# Patient Record
Sex: Male | Born: 1950 | Race: White | Hispanic: No | Marital: Married | Smoking: Former smoker
Health system: Southern US, Community
[De-identification: ages and names within clinical notes are randomized; demographics above are authoritative.]

## PROBLEM LIST (undated history)

## (undated) DIAGNOSIS — E119 Type 2 diabetes mellitus without complications: Secondary | ICD-10-CM

## (undated) DIAGNOSIS — I10 Essential (primary) hypertension: Secondary | ICD-10-CM

## (undated) DIAGNOSIS — C801 Malignant (primary) neoplasm, unspecified: Secondary | ICD-10-CM

## (undated) DIAGNOSIS — J45909 Unspecified asthma, uncomplicated: Secondary | ICD-10-CM

## (undated) HISTORY — PX: TONSILLECTOMY: SUR1361

## (undated) HISTORY — PX: OTHER SURGICAL HISTORY: SHX169

## (undated) HISTORY — PX: HEMORRHOID SURGERY: SHX153

## (undated) HISTORY — PX: APPENDECTOMY: SHX54

---

## 1998-12-12 ENCOUNTER — Ambulatory Visit (HOSPITAL_BASED_OUTPATIENT_CLINIC_OR_DEPARTMENT_OTHER): Admission: RE | Admit: 1998-12-12 | Discharge: 1998-12-12 | Payer: Self-pay | Admitting: General Surgery

## 1999-01-30 ENCOUNTER — Encounter: Admission: RE | Admit: 1999-01-30 | Discharge: 1999-04-30 | Payer: Self-pay | Admitting: Family Medicine

## 2000-02-18 ENCOUNTER — Encounter: Payer: Self-pay | Admitting: Family Medicine

## 2000-02-18 ENCOUNTER — Encounter: Admission: RE | Admit: 2000-02-18 | Discharge: 2000-02-18 | Payer: Self-pay | Admitting: Family Medicine

## 2001-08-08 ENCOUNTER — Ambulatory Visit (HOSPITAL_COMMUNITY): Admission: RE | Admit: 2001-08-08 | Discharge: 2001-08-08 | Payer: Self-pay | Admitting: Family Medicine

## 2001-08-08 ENCOUNTER — Encounter: Payer: Self-pay | Admitting: Family Medicine

## 2007-03-15 ENCOUNTER — Encounter (INDEPENDENT_AMBULATORY_CARE_PROVIDER_SITE_OTHER): Payer: Self-pay | Admitting: Specialist

## 2007-03-15 ENCOUNTER — Ambulatory Visit (HOSPITAL_COMMUNITY): Admission: RE | Admit: 2007-03-15 | Discharge: 2007-03-16 | Payer: Self-pay | Admitting: General Surgery

## 2009-06-20 ENCOUNTER — Emergency Department (HOSPITAL_BASED_OUTPATIENT_CLINIC_OR_DEPARTMENT_OTHER): Admission: EM | Admit: 2009-06-20 | Discharge: 2009-06-20 | Payer: Self-pay | Admitting: Emergency Medicine

## 2009-06-20 ENCOUNTER — Ambulatory Visit: Payer: Self-pay | Admitting: Diagnostic Radiology

## 2009-06-24 ENCOUNTER — Encounter: Admission: RE | Admit: 2009-06-24 | Discharge: 2009-06-24 | Payer: Self-pay | Admitting: Gastroenterology

## 2011-02-06 LAB — BASIC METABOLIC PANEL
BUN: 11 mg/dL (ref 6–23)
Calcium: 9.1 mg/dL (ref 8.4–10.5)
GFR calc non Af Amer: >60 mL/min (ref >60)
Glucose, Bld: 118 mg/dL — ABNORMAL HIGH (ref 70–99)
Potassium: 4.1 mEq/L (ref 3.5–5.1)
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
## 2013-01-10 ENCOUNTER — Other Ambulatory Visit: Payer: Self-pay

## 2014-11-29 ENCOUNTER — Encounter: Payer: Self-pay | Admitting: Internal Medicine

## 2015-01-09 ENCOUNTER — Other Ambulatory Visit (HOSPITAL_COMMUNITY): Payer: Self-pay

## 2015-01-09 ENCOUNTER — Inpatient Hospital Stay (HOSPITAL_COMMUNITY)
Admission: EM | Admit: 2015-01-09 | Discharge: 2015-01-11 | DRG: 202 | Disposition: A | Discharge status: Home / Self Care | Payer: BLUE CROSS/BLUE SHIELD | Attending: Internal Medicine | Admitting: Internal Medicine

## 2015-01-09 ENCOUNTER — Emergency Department (HOSPITAL_COMMUNITY): Payer: BLUE CROSS/BLUE SHIELD

## 2015-01-09 ENCOUNTER — Encounter (HOSPITAL_COMMUNITY): Payer: Self-pay

## 2015-01-09 DIAGNOSIS — J209 Acute bronchitis, unspecified: Principal | ICD-10-CM | POA: Diagnosis present

## 2015-01-09 DIAGNOSIS — Z6841 Body Mass Index (BMI) 40.0 and over, adult: Secondary | ICD-10-CM

## 2015-01-09 DIAGNOSIS — Z87891 Personal history of nicotine dependence: Secondary | ICD-10-CM

## 2015-01-09 DIAGNOSIS — Z79899 Other long term (current) drug therapy: Secondary | ICD-10-CM

## 2015-01-09 DIAGNOSIS — E1165 Type 2 diabetes mellitus with hyperglycemia: Secondary | ICD-10-CM | POA: Diagnosis present

## 2015-01-09 DIAGNOSIS — IMO0002 Reserved for concepts with insufficient information to code with codable children: Secondary | ICD-10-CM | POA: Diagnosis present

## 2015-01-09 DIAGNOSIS — R6510 Systemic inflammatory response syndrome (SIRS) of non-infectious origin without acute organ dysfunction: Secondary | ICD-10-CM | POA: Diagnosis present

## 2015-01-09 DIAGNOSIS — I10 Essential (primary) hypertension: Secondary | ICD-10-CM | POA: Diagnosis present

## 2015-01-09 DIAGNOSIS — J45909 Unspecified asthma, uncomplicated: Secondary | ICD-10-CM | POA: Diagnosis present

## 2015-01-09 DIAGNOSIS — R0602 Shortness of breath: Secondary | ICD-10-CM

## 2015-01-09 DIAGNOSIS — E669 Obesity, unspecified: Secondary | ICD-10-CM | POA: Diagnosis present

## 2015-01-09 HISTORY — DX: Type 2 diabetes mellitus without complications: E11.9

## 2015-01-09 HISTORY — DX: Unspecified asthma, uncomplicated: J45.909

## 2015-01-09 HISTORY — DX: Essential (primary) hypertension: I10

## 2015-01-09 HISTORY — DX: Malignant (primary) neoplasm, unspecified: C80.1

## 2015-01-09 LAB — BASIC METABOLIC PANEL
Anion gap: 12 (ref 5–15)
BUN: 13 mg/dL (ref 6–23)
CALCIUM: 9.4 mg/dL (ref 8.4–10.5)
CHLORIDE: 96 mmol/L (ref 96–112)
CO2: 24 mmol/L (ref 19–32)
CREATININE: 1.04 mg/dL (ref 0.50–1.35)
GFR calc non Af Amer: 75 mL/min — ABNORMAL LOW (ref >90)
GFR, EST AFRICAN AMERICAN: 87 mL/min — AB (ref >90)
GLUCOSE: 242 mg/dL — AB (ref 70–99)
Potassium: 4.3 mmol/L (ref 3.5–5.1)
SODIUM: 132 mmol/L — AB (ref 135–145)

## 2015-01-09 LAB — CBC WITH DIFFERENTIAL/PLATELET
Basophils Absolute: 0 10*3/uL (ref 0.0–0.1)
Basophils Relative: 0 % (ref 0–1)
EOS ABS: 0.1 10*3/uL (ref 0.0–0.7)
EOS PCT: 1 % (ref 0–5)
HEMATOCRIT: 46.1 % (ref 39.0–52.0)
HEMOGLOBIN: 16.3 g/dL (ref 13.0–17.0)
LYMPHS ABS: 0.5 10*3/uL — AB (ref 0.7–4.0)
Lymphocytes Relative: 4 % — ABNORMAL LOW (ref 12–46)
MCH: 31.3 pg (ref 26.0–34.0)
MCHC: 35.4 g/dL (ref 30.0–36.0)
MCV: 88.5 fL (ref 78.0–100.0)
Monocytes Absolute: 0.5 10*3/uL (ref 0.1–1.0)
Monocytes Relative: 4 % (ref 3–12)
NEUTROS PCT: 91 % — AB (ref 43–77)
Neutro Abs: 11.1 10*3/uL — ABNORMAL HIGH (ref 1.7–7.7)
PLATELETS: 180 10*3/uL (ref 150–400)
RBC: 5.21 MIL/uL (ref 4.22–5.81)
RDW: 13.6 % (ref 11.5–15.5)
WBC: 12.1 10*3/uL — ABNORMAL HIGH (ref 4.0–10.5)

## 2015-01-09 LAB — I-STAT ARTERIAL BLOOD GAS, ED
ACID-BASE DEFICIT: 2 mmol/L (ref 0.0–2.0)
BICARBONATE: 22 meq/L (ref 20.0–24.0)
O2 SAT: 96 %
PCO2 ART: 34.7 mmHg — AB (ref 35.0–45.0)
TCO2: 23 mmol/L (ref 0–100)
pH, Arterial: 7.41 (ref 7.350–7.450)
pO2, Arterial: 80 mmHg (ref 80.0–100.0)

## 2015-01-09 LAB — I-STAT CG4 LACTIC ACID, ED
LACTIC ACID, VENOUS: 2.52 mmol/L — AB (ref 0.5–2.0)
LACTIC ACID, VENOUS: 2.86 mmol/L — AB (ref 0.5–2.0)

## 2015-01-09 LAB — I-STAT TROPONIN, ED: TROPONIN I, POC: 0 ng/mL (ref 0.00–0.08)

## 2015-01-09 MED ORDER — SODIUM CHLORIDE 0.9 % IV BOLUS (SEPSIS)
30.0000 mL/kg | Freq: Once | INTRAVENOUS | Status: AC
  Administered 2015-01-09: 4149 mL via INTRAVENOUS

## 2015-01-09 MED ORDER — IOHEXOL 350 MG/ML SOLN
80.0000 mL | Freq: Once | INTRAVENOUS | Status: AC | PRN
  Administered 2015-01-09: 80 mL via INTRAVENOUS

## 2015-01-09 MED ORDER — ACETAMINOPHEN 325 MG PO TABS
650.0000 mg | ORAL_TABLET | Freq: Once | ORAL | Status: AC
  Administered 2015-01-09: 650 mg via ORAL
  Filled 2015-01-09: qty 2

## 2015-01-09 MED ORDER — AZITHROMYCIN 500 MG IV SOLR
500.0000 mg | Freq: Once | INTRAVENOUS | Status: AC
  Administered 2015-01-09: 500 mg via INTRAVENOUS
  Filled 2015-01-09: qty 500

## 2015-01-09 MED ORDER — DEXTROSE 5 % IV SOLN
1.0000 g | Freq: Once | INTRAVENOUS | Status: AC
  Administered 2015-01-09: 1 g via INTRAVENOUS
  Filled 2015-01-09: qty 10

## 2015-01-09 NOTE — ED Notes (Signed)
Per EMS, pt started having severe SOB, so he drove himself to the [facility]. The MD there stated that the pt started having bronchospasms that caused the pt to become cyanotic and confused. Pt with bronchitis x 2 weeks. Pt was given 2 mg Albuterol at the MD office, and the pt's symptoms improved. Pt was alert and oriented for EMS upon their arrival.

## 2015-01-09 NOTE — ED Provider Notes (Signed)
CSN: [id number]     Arrival date & time 01/09/15  1952 History   First MD Initiated Contact with Patient 01/09/15 1953     Chief Complaint  Patient presents with  . Shortness of Breath     (Consider location/radiation/quality/duration/timing/severity/associated sxs/prior Treatment) HPI   Patient is presenting with worsening shortness of breath, worsened over the past day.  He has been having a week of cough which has been productive.  No known sick contacts. He has been being followed as an outpatient for presumed bronchitis.  Went to urgent care today where he was found to be short of breath, in resp distress and he was sent to the emergency department for further evaluation.  No chest pain.  No past medical history on file. No past surgical history on file. No family history on file. History  Substance Use Topics  . Smoking status: Not on file  . Smokeless tobacco: Not on file  . Alcohol Use: Not on file    Review of Systems  Constitutional: Positive for fever. Negative for chills.  Eyes: Negative for redness.  Respiratory: Positive for cough and shortness of breath.   Cardiovascular: Negative for chest pain.  Gastrointestinal: Negative for nausea, vomiting, abdominal pain and diarrhea.  Genitourinary: Negative for dysuria.  Skin: Negative for rash.  Neurological: Negative for headaches.  All other systems reviewed and are negative.     Allergies  Review of patient's allergies indicates not on file.  Home Medications   Prior to Admission medications   Medication Sig Start Date End Date Taking? Authorizing Provider  lisinopril (PRINIVIL,ZESTRIL) 10 MG tablet Take 10 mg by mouth daily. 01/03/15   Historical Provider, MD  VICTOZA 18 MG/3ML SOPN Inject 1.8 mg into the skin daily. 10/13/14   Historical Provider, MD   BP 129/83 mmHg  Pulse 127  Temp(Src) 103.1 F (39.5 C)  Resp 31  SpO2 94% Physical Exam  Constitutional: He appears distressed.  HENT:  Head:  Normocephalic and atraumatic.  Eyes: EOM are normal. Pupils are equal, round, and reactive to light.  Neck: Normal range of motion. Neck supple.  Cardiovascular: Normal rate.   Pulmonary/Chest: He is in respiratory distress. He has wheezes (faint).  Abdominal: Soft. There is no tenderness.  Skin: No rash noted. He is diaphoretic.    ED Course  Procedures (including critical care time) Labs Review Labs Reviewed  CULTURE, BLOOD (ROUTINE X 2)  CULTURE, BLOOD (ROUTINE X 2)  CBC WITH DIFFERENTIAL/PLATELET  BASIC METABOLIC PANEL  BRAIN NATRIURETIC PEPTIDE  LACTIC ACID, PLASMA  LACTIC ACID, PLASMA  I-STAT TROPOININ, ED  I-STAT ARTERIAL BLOOD GAS, ED    Imaging Review No results found.   EKG Interpretation None      MDM   Final diagnoses:  None    No sig PMH is presenting with shortness of breath, fever, cough, worsened over the past day.    Exam as above, febrile, tachy, tachypnic.  Concern for sepsis.  Will obtain bcx, cover for CAP w/ rocephin azithro.  Will obtain labs/lactic.  Lactic elevated will give 1ml/kg NS bolus.  Will give some albut.  WOB improved after albut.  Patient improving.  CXR w/o infiltrate. CTA PE study obtained and w/o infiltrate/PE  Will admit for likely PNA vs. Influenza. Influenza swab not back yet.    Arrangements made for admission. Feel floor stable    Jarome Matin, MD 01/10/15 0093  Jola Schmidt, MD 01/14/15 [phone]

## 2015-01-10 ENCOUNTER — Encounter (HOSPITAL_COMMUNITY): Payer: Self-pay | Admitting: Internal Medicine

## 2015-01-10 DIAGNOSIS — A419 Sepsis, unspecified organism: Secondary | ICD-10-CM

## 2015-01-10 DIAGNOSIS — E1165 Type 2 diabetes mellitus with hyperglycemia: Secondary | ICD-10-CM

## 2015-01-10 DIAGNOSIS — R6510 Systemic inflammatory response syndrome (SIRS) of non-infectious origin without acute organ dysfunction: Secondary | ICD-10-CM | POA: Diagnosis present

## 2015-01-10 DIAGNOSIS — J209 Acute bronchitis, unspecified: Secondary | ICD-10-CM | POA: Diagnosis present

## 2015-01-10 DIAGNOSIS — E669 Obesity, unspecified: Secondary | ICD-10-CM | POA: Diagnosis present

## 2015-01-10 DIAGNOSIS — I10 Essential (primary) hypertension: Secondary | ICD-10-CM | POA: Diagnosis present

## 2015-01-10 DIAGNOSIS — J45909 Unspecified asthma, uncomplicated: Secondary | ICD-10-CM | POA: Diagnosis present

## 2015-01-10 DIAGNOSIS — J208 Acute bronchitis due to other specified organisms: Secondary | ICD-10-CM | POA: Diagnosis not present

## 2015-01-10 DIAGNOSIS — Z6841 Body Mass Index (BMI) 40.0 and over, adult: Secondary | ICD-10-CM | POA: Diagnosis not present

## 2015-01-10 DIAGNOSIS — IMO0002 Reserved for concepts with insufficient information to code with codable children: Secondary | ICD-10-CM | POA: Diagnosis present

## 2015-01-10 DIAGNOSIS — Z79899 Other long term (current) drug therapy: Secondary | ICD-10-CM | POA: Diagnosis not present

## 2015-01-10 DIAGNOSIS — Z87891 Personal history of nicotine dependence: Secondary | ICD-10-CM | POA: Diagnosis not present

## 2015-01-10 DIAGNOSIS — R0602 Shortness of breath: Secondary | ICD-10-CM | POA: Diagnosis present

## 2015-01-10 LAB — CBC WITH DIFFERENTIAL/PLATELET
Basophils Absolute: 0 10*3/uL (ref 0.0–0.1)
Basophils Relative: 0 % (ref 0–1)
Eosinophils Absolute: 0 10*3/uL (ref 0.0–0.7)
Eosinophils Relative: 0 % (ref 0–5)
HEMATOCRIT: 41.3 % (ref 39.0–52.0)
HEMOGLOBIN: 14.1 g/dL (ref 13.0–17.0)
LYMPHS ABS: 0.9 10*3/uL (ref 0.7–4.0)
Lymphocytes Relative: 7 % — ABNORMAL LOW (ref 12–46)
MCH: 30.2 pg (ref 26.0–34.0)
MCHC: 34.1 g/dL (ref 30.0–36.0)
MCV: 88.4 fL (ref 78.0–100.0)
MONOS PCT: 9 % (ref 3–12)
Monocytes Absolute: 1.2 10*3/uL — ABNORMAL HIGH (ref 0.1–1.0)
NEUTROS ABS: 11.5 10*3/uL — AB (ref 1.7–7.7)
NEUTROS PCT: 84 % — AB (ref 43–77)
Platelets: 197 10*3/uL (ref 150–400)
RBC: 4.67 MIL/uL (ref 4.22–5.81)
RDW: 13.9 % (ref 11.5–15.5)
WBC: 13.6 10*3/uL — ABNORMAL HIGH (ref 4.0–10.5)

## 2015-01-10 LAB — COMPREHENSIVE METABOLIC PANEL
ALK PHOS: 32 U/L — AB (ref 39–117)
ALT: 57 U/L — ABNORMAL HIGH (ref 0–53)
AST: 42 U/L — AB (ref 0–37)
Albumin: 3.3 g/dL — ABNORMAL LOW (ref 3.5–5.2)
Anion gap: 8 (ref 5–15)
BILIRUBIN TOTAL: 0.7 mg/dL (ref 0.3–1.2)
BUN: 9 mg/dL (ref 6–23)
CALCIUM: 8.3 mg/dL — AB (ref 8.4–10.5)
CO2: 26 mmol/L (ref 19–32)
Chloride: 101 mmol/L (ref 96–112)
Creatinine, Ser: 0.92 mg/dL (ref 0.50–1.35)
GFR calc Af Amer: >90 mL/min (ref >90)
GFR calc non Af Amer: 89 mL/min — ABNORMAL LOW (ref >90)
Glucose, Bld: 206 mg/dL — ABNORMAL HIGH (ref 70–99)
POTASSIUM: 4.2 mmol/L (ref 3.5–5.1)
SODIUM: 135 mmol/L (ref 135–145)
Total Protein: 6 g/dL (ref 6.0–8.3)

## 2015-01-10 LAB — URINALYSIS, ROUTINE W REFLEX MICROSCOPIC
BILIRUBIN URINE: NEGATIVE
Glucose, UA: 100 mg/dL — AB
HGB URINE DIPSTICK: NEGATIVE
Ketones, ur: NEGATIVE mg/dL
Leukocytes, UA: NEGATIVE
Nitrite: NEGATIVE
Protein, ur: NEGATIVE mg/dL
Specific Gravity, Urine: 1.027 (ref 1.005–1.030)
UROBILINOGEN UA: 0.2 mg/dL (ref 0.0–1.0)
pH: 5.5 (ref 5.0–8.0)

## 2015-01-10 LAB — GLUCOSE, CAPILLARY
GLUCOSE-CAPILLARY: 162 mg/dL — AB (ref 70–99)
GLUCOSE-CAPILLARY: 165 mg/dL — AB (ref 70–99)
Glucose-Capillary: 247 mg/dL — ABNORMAL HIGH (ref 70–99)
Glucose-Capillary: 181 mg/dL — ABNORMAL HIGH (ref 70–99)
Glucose-Capillary: 137 mg/dL — ABNORMAL HIGH (ref 70–99)

## 2015-01-10 LAB — INFLUENZA PANEL BY PCR (TYPE A & B)
H1N1FLUPCR: NOT DETECTED
INFLAPCR: NEGATIVE
INFLBPCR: NEGATIVE

## 2015-01-10 LAB — BRAIN NATRIURETIC PEPTIDE: B NATRIURETIC PEPTIDE 5: 23 pg/mL (ref 0.0–100.0)

## 2015-01-10 MED ORDER — INSULIN ASPART 100 UNIT/ML ~~LOC~~ SOLN
0.0000 [IU] | Freq: Three times a day (TID) | SUBCUTANEOUS | Status: DC
  Administered 2015-01-10 – 2015-01-11 (×3): 2 [IU] via SUBCUTANEOUS

## 2015-01-10 MED ORDER — ACETAMINOPHEN 325 MG PO TABS: 650.0000 mg | ORAL_TABLET | Freq: Four times a day (QID) | ORAL | Status: DC | PRN

## 2015-01-10 MED ORDER — SODIUM CHLORIDE 0.9 % IV SOLN
INTRAVENOUS | Status: AC
  Administered 2015-01-10 (×2): via INTRAVENOUS

## 2015-01-10 MED ORDER — LIRAGLUTIDE 18 MG/3ML ~~LOC~~ SOPN
1.8000 mg | PEN_INJECTOR | Freq: Every day | SUBCUTANEOUS | Status: DC
  Administered 2015-01-10 – 2015-01-11 (×2): 1.8 mg via SUBCUTANEOUS

## 2015-01-10 MED ORDER — ACETAMINOPHEN 650 MG RE SUPP: 650.0000 mg | Freq: Four times a day (QID) | RECTAL | Status: DC | PRN

## 2015-01-10 MED ORDER — CEFTRIAXONE SODIUM IN DEXTROSE 20 MG/ML IV SOLN
1.0000 g | INTRAVENOUS | Status: DC
  Administered 2015-01-10: 1 g via INTRAVENOUS
  Filled 2015-01-10 (×2): qty 50

## 2015-01-10 MED ORDER — DEXTROSE 5 % IV SOLN
500.0000 mg | INTRAVENOUS | Status: DC
  Administered 2015-01-10: 500 mg via INTRAVENOUS
  Filled 2015-01-10 (×2): qty 500

## 2015-01-10 MED ORDER — ONDANSETRON HCL 4 MG/2ML IJ SOLN: 4.0000 mg | Freq: Four times a day (QID) | INTRAMUSCULAR | Status: DC | PRN

## 2015-01-10 MED ORDER — ONDANSETRON HCL 4 MG PO TABS: 4.0000 mg | ORAL_TABLET | Freq: Four times a day (QID) | ORAL | Status: DC | PRN

## 2015-01-10 MED ORDER — ALBUTEROL SULFATE (2.5 MG/3ML) 0.083% IN NEBU: 2.5000 mg | INHALATION_SOLUTION | RESPIRATORY_TRACT | Status: DC | PRN

## 2015-01-10 MED ORDER — ALBUTEROL SULFATE (2.5 MG/3ML) 0.083% IN NEBU
2.5000 mg | INHALATION_SOLUTION | Freq: Four times a day (QID) | RESPIRATORY_TRACT | Status: DC
  Administered 2015-01-10 – 2015-01-11 (×3): 2.5 mg via RESPIRATORY_TRACT
  Filled 2015-01-10 (×4): qty 3

## 2015-01-10 MED ORDER — ENOXAPARIN SODIUM 80 MG/0.8ML ~~LOC~~ SOLN
70.0000 mg | SUBCUTANEOUS | Status: DC
  Administered 2015-01-10 – 2015-01-11 (×2): 70 mg via SUBCUTANEOUS
  Filled 2015-01-10 (×3): qty 0.8

## 2015-01-10 MED ORDER — LISINOPRIL 10 MG PO TABS
10.0000 mg | ORAL_TABLET | Freq: Every day | ORAL | Status: DC
  Administered 2015-01-10 – 2015-01-11 (×2): 10 mg via ORAL
  Filled 2015-01-10 (×2): qty 1

## 2015-01-10 NOTE — H&P (Signed)
[facility] History and Physical  Marc Serrano TKZ:[id number] DOB: Jul 09, 1951 DOA: 01/09/2015  Referring physician: ER physician. PCP: Reginia Naas, MD   Chief Complaint: Shortness of breath and fever.  HPI: Marc Serrano is a 64 y.o. male with history of diabetes mellitus type 2 and hypertension was referred to the ER by an urgent care center for fever and shortness of breath. Patient states that he has been having cough for the last few days and last afternoon started developing shortness of breath with wheezing and fever chills and diaphoresis. Denies any chest pain or palpitations. Patient had gone to the urgent care and was referred to the ER. In the ER patient was found to have fever 103F. Tachycardia. Patient complained of productive cough yellowish in color. Denies any dysuria or discharge or diarrhea. CT of the chest was negative for any PE or any infiltrates. Blood cultures were obtained and since patient was having productive cough patient was started on empiric antibiotics for possible pneumonia. Patient has been admitted for further management. On my exam patient's wheezing is largely improved after patient got nebulizer.  Review of Systems: As presented in the history of presenting illness, rest negative.  Past Medical History  Diagnosis Date  . Asthma   . Cancer   . Diabetes mellitus without complication   . Hypertension    Past Surgical History  Procedure Laterality Date  . Appendectomy    . Tonsillectomy    . Sphincterectomy rectal    . Hemorrhoid surgery     Social History:  reports that he has quit smoking. He does not have any smokeless tobacco history on file. He reports that he drinks alcohol. His drug history is not on file. Where does patient live home. Can patient participate in ADLs? [facility]  Not on File  Family History:  Family History  Problem Relation Age of Onset  . Congestive Heart Failure Father   . Diabetes Mellitus II Father   . CAD  Brother       Prior to Admission medications   Medication Sig Start Date End Date Taking? Authorizing Provider  clobetasol cream (TEMOVATE) 5.73 % Apply 1 application topically as needed (for itching).   Yes Historical Provider, MD  lisinopril (PRINIVIL,ZESTRIL) 10 MG tablet Take 10 mg by mouth daily. 01/03/15  Yes Historical Provider, MD  VICTOZA 18 MG/3ML SOPN Inject 1.8 mg into the skin daily. 10/13/14  Yes Historical Provider, MD    Physical Exam: Filed Vitals:   01/09/15 2145 01/09/15 2330 01/10/15 0011 01/10/15 0030  BP: 134/75 113/65 114/77 108/70  Pulse: 125 102 102 101  Temp:      TempSrc:      Resp: 17 25 11 26   Weight:      SpO2: 97% 96% 94% 95%     General:  Obese not in distress.  Eyes: Anicteric no pallor.  ENT: No discharge from the ears eyes nose and mouth.  Neck: No mass felt.  Cardiovascular: S1-S2 heard.  Respiratory: No rhonchi or crepitations.  Abdomen: Soft nontender bowel sounds present.  Skin: No rash.  Musculoskeletal: No edema.  Psychiatric: Appears normal.  Neurologic: Alert awake oriented to time place and person. Moves all extremities.  Labs on Admission:  Basic Metabolic Panel:  Recent Labs Lab 01/09/15 2028  NA 132*  K 4.3  CL 96  CO2 24  GLUCOSE 242*  BUN 13  CREATININE 1.04  CALCIUM 9.4   Liver Function Tests: No results for input(s): AST, ALT,  ALKPHOS, BILITOT, PROT, ALBUMIN in the last 168 hours. No results for input(s): LIPASE, AMYLASE in the last 168 hours. No results for input(s): AMMONIA in the last 168 hours. CBC:  Recent Labs Lab 01/09/15 2028  WBC 12.1*  NEUTROABS 11.1*  HGB 16.3  HCT 46.1  MCV 88.5  PLT 180   Cardiac Enzymes: No results for input(s): CKTOTAL, CKMB, CKMBINDEX, TROPONINI in the last 168 hours.  BNP (last 3 results) No results for input(s): BNP in the last 8760 hours.  ProBNP (last 3 results) No results for input(s): PROBNP in the last 8760 hours.  CBG: No results for input(s):  GLUCAP in the last 168 hours.  Radiological Exams on Admission: Ct Angio Chest Pe W/cm &/or Wo Cm  01/09/2015   CLINICAL DATA:  Severe shortness of breath. Initial presentation thecal position clinic. Diagnosed with bronchospasm and bronchitis.  EXAM: CT ANGIOGRAPHY CHEST WITH CONTRAST  TECHNIQUE: Multidetector CT imaging of the chest was performed using the standard protocol during bolus administration of intravenous contrast. Multiplanar CT image reconstructions and MIPs were obtained to evaluate the vascular anatomy.  CONTRAST:  28mL OMNIPAQUE IOHEXOL 350 MG/ML SOLN  COMPARISON:  None.  FINDINGS: Technically adequate study with moderately good opacification of the central and proximal segmental pulmonary arteries. Some limitation of contrast bolus results in poor visualization of smaller peripheral vessels. No focal filling defect is demonstrated to suggest significant central pulmonary embolus.  Normal heart size. Normal caliber thoracic aorta. No evidence of aortic dissection. Great vessel origins are patent. Coronary artery and aortic calcifications. No significant lymphadenopathy in the chest. Esophagus is decompressed. Small esophageal hiatal hernia.  Evaluation of lungs is limited due to motion artifact. There is evidence of atelectasis in the lung bases. No focal consolidation or airspace disease. Airways appear patent. No pleural effusion. No pneumothorax.  Included portions of the upper abdominal organs demonstrate diffuse fatty infiltration of the liver. Degenerative changes in the spine. No destructive bone lesions.  Review of the MIP images confirms the above findings.  IMPRESSION: No evidence of significant central pulmonary embolus although peripheral vessels are not well seen. Atelectasis in the lung bases. No focal consolidation. Diffuse fatty infiltration of the liver.   Electronically Signed   By: Lucienne Capers M.D.   On: 01/09/2015 23:20   Dg Chest Portable 1 View  01/09/2015    CLINICAL DATA:  Shortness of breath, fever.  Symptoms for 2 weeks.  EXAM: PORTABLE CHEST - 1 VIEW  COMPARISON:  03/13/2007  FINDINGS: Very low lung volumes accentuating the cardiac size. Likely mild cardiomegaly. Mild bibasilar subsegmental atelectasis, no consolidation to suggest pneumonia. No large pleural effusion or pneumothorax. No acute osseous abnormalities are seen.  IMPRESSION: Hypoventilatory chest with bibasilar subsegmental atelectasis. Likely mild cardiomegaly.   Electronically Signed   By: Jeb Levering M.D.   On: 01/09/2015 21:01    EKG: Independently reviewed. Sinus tachycardia.  Assessment/Plan Principal Problem:   SIRS (systemic inflammatory response syndrome) Active Problems:   Diabetes mellitus type 2, uncontrolled   Hypertension   1. SIRS - sources respiratory tract. There is no definite infiltrates in the chest x-ray or CT chest. But given patient's symptoms at this time patient has been placed empirically on antibiotics for possible pneumonia. Follow blood cultures UA is pending. Check influenza PCR. Continue hydration. 2. Diabetes mellitus type 2 uncontrolled - in addition to patient's home medication I'll place patient on sliding scale coverage. Closely follow CBG. 3. Hypertension - continue medications.   DVT Prophylaxis  Lovenox.  Code Status: Full code.  Family Communication: Patient's family at the bedside.  Disposition Plan: Admit to inpatient.    Dorothey Oetken N. [facility] Pager [phone].  If 7PM-7AM, please contact night-coverage [email or web address] Password [email or web address] 01/10/2015, 1:04 AM

## 2015-01-10 NOTE — Progress Notes (Signed)
Pt seen and examined, admitted this am per Dr.Kakrakandy, pls see H&P for details 1. Early pneumonia vs bronchitis -improving, temp down -continue ceftriaxone and zithromax -FU blood Cx  2. DM 2: -SSI, victoza on hold  3. HTN -continue lisinopril  DVT proph: lovenox  Domenic Polite, MD [phone]

## 2015-01-10 NOTE — Progress Notes (Addendum)
64yo male c/o severe SOB after dx of bronchitis x2wk, CXR/CT negative but concern for PNA, to begin empiric ABX.  Will start Rocephin 1g IV Q24H and monitor CBC and Cx.  Wynona Neat, PharmD, BCPS 01/10/2015 1:38 AM   Addendum Patient is flagged for pharmacy review for other medications aside from ceftriaxone consult. Therefore, will sign off of formal consult since no renal adjustment is required for ceftriaxone and will follow peripherally for c/s and LOT.  Nalda Shackleford D. Eyvette Cordon, PharmD, BCPS Clinical Pharmacist Pager: [phone] 01/10/2015 8:59 AM

## 2015-01-10 NOTE — Progress Notes (Signed)
ANTICOAGULATION CONSULT NOTE - Initial Consult  Pharmacy Consult for Lovenox Indication: VTE prophylaxis  Allergies  Allergen Reactions  . Ceftin [Cefuroxime Axetil] Diarrhea    Patient Measurements: Height: 5\' 8"  (172.7 cm) Weight: (!) 308 lb 3.2 oz (139.799 kg) IBW/kg (Calculated) : 68.4   Vital Signs: Temp: 97.7 F (36.5 C) (03/11 0139) Temp Source: Oral (03/11 0139) BP: 126/74 mmHg (03/11 0139) Pulse Rate: 93 (03/11 0139)  Labs:  Recent Labs  01/09/15 2028  HGB 16.3  HCT 46.1  PLT 180  CREATININE 1.04    Estimated Creatinine Clearance: 101 mL/min (by C-G formula based on Cr of 1.04).   Medical History: Past Medical History  Diagnosis Date  . Asthma   . Cancer   . Diabetes mellitus without complication   . Hypertension     Medications:  Prescriptions prior to admission  Medication Sig Dispense Refill Last Dose  . clobetasol cream (TEMOVATE) 1.54 % Apply 1 application topically as needed (for itching).   Past Month at Unknown time  . lisinopril (PRINIVIL,ZESTRIL) 10 MG tablet Take 10 mg by mouth daily.  0 01/09/2015 at Unknown time  . VICTOZA 18 MG/3ML SOPN Inject 1.8 mg into the skin daily.  0 01/09/2015 at Unknown time   Scheduled:  . azithromycin  500 mg Intravenous Q24H  . cefTRIAXone (ROCEPHIN)  IV  1 g Intravenous Q24H  . enoxaparin (LOVENOX) injection  70 mg Subcutaneous Q24H  . insulin aspart  0-9 Units Subcutaneous TID WC  . Liraglutide  1.8 mg Subcutaneous Daily  . lisinopril  10 mg Oral Daily    Assessment: 64 y.o male admitted with SIRS.  Lovenox for VTE prophylaxis ordered.  Obesity, BMI 47.  H/H 16.3/46.1, pltc 180K.  SCr 1.04, CrCl ~ 100 ml/min. No bleeding reported. Will adjust lovenox dose to 0.5mg /kg/q24h for VTE prophylaxis.    Goal of Therapy:  Heparin level 0.3-0.6 units/ml Monitor platelets by anticoagulation protocol: Yes   Plan:  Lovenox 70 mg SQ q24h (0.5mg /kg q24h) CBC q72h  Nicole Cella, RPh Clinical  Pharmacist Pager: [phone] 01/10/2015,2:12 AM

## 2015-01-10 NOTE — Progress Notes (Signed)
Pt states he will place himself on cpap when ready. Encouraged to call with questions. CPAP 10 setup and water refilled in water chamber. No needs at this time.

## 2015-01-11 DIAGNOSIS — J209 Acute bronchitis, unspecified: Secondary | ICD-10-CM | POA: Diagnosis present

## 2015-01-11 DIAGNOSIS — J208 Acute bronchitis due to other specified organisms: Secondary | ICD-10-CM

## 2015-01-11 LAB — GLUCOSE, CAPILLARY
GLUCOSE-CAPILLARY: 162 mg/dL — AB (ref 70–99)
Glucose-Capillary: 100 mg/dL — ABNORMAL HIGH (ref 70–99)

## 2015-01-11 MED ORDER — LEVOFLOXACIN 500 MG PO TABS: 500.0000 mg | ORAL_TABLET | Freq: Every day | ORAL | Status: AC

## 2015-01-11 MED ORDER — LEVOFLOXACIN 500 MG PO TABS
500.0000 mg | ORAL_TABLET | Freq: Every day | ORAL | Status: DC
  Administered 2015-01-11: 500 mg via ORAL
  Filled 2015-01-11: qty 1

## 2015-01-11 MED ORDER — GUAIFENESIN ER 600 MG PO TB12
600.0000 mg | ORAL_TABLET | Freq: Two times a day (BID) | ORAL | Status: DC | PRN
  Filled 2015-01-11: qty 1

## 2015-01-11 MED ORDER — GUAIFENESIN ER 600 MG PO TB12: 600.0000 mg | ORAL_TABLET | Freq: Two times a day (BID) | ORAL | Status: AC | PRN

## 2015-01-12 NOTE — Discharge Summary (Addendum)
Physician Discharge Summary  Marc Serrano ZOX:[id number] DOB: [birth year] DOA: 01/09/2015  PCP: Reginia Naas, MD  Admit date: 01/09/2015 Discharge date: 01/11/2015  Time spent: 45 minutes  Recommendations for Outpatient Follow-up:  1. Monitor CBGs and may need escalation of therapy  Discharge Diagnoses:    Bronchitis vs Early Pneumonia   SIRS (systemic inflammatory response syndrome)   Diabetes mellitus type 2, uncontrolled   Hypertension   Acute bronchitis   Discharge Condition: stable  Diet recommendation:diabetic  Filed Weights   01/09/15 2036 01/10/15 0139 01/10/15 2128  Weight: 138.347 kg (305 lb) 139.799 kg (308 lb 3.2 oz) 141.114 kg (311 lb 1.6 oz)    History of present illness:  Marc Serrano is a 64 y.o. male with history of diabetes mellitus type 2 and hypertension was referred to the ER by an urgent care center for fever and shortness of breath. Patient stated that he had been having cough for few days and last afternoon started developing shortness of breath with wheezing and fever chills and diaphoresis.  Hospital Course:  1. Early pneumonia vs bronchitis -CXR and CT chest unremarkable but clinical symptomatology consistent with this. -improved, temp down -treated with ceftriaxone and zithromax and then transitioned to levaquin at discharge -Blood Cx negative so far  2. DM 2: -SSI, victoza resumed -CBgs running in 200s in the hospital, needs close monitoring and addition of more meds if persists  3. HTN -continue lisinopril   Discharge Exam: Filed Vitals:   01/11/15 0920  BP: 135/82  Pulse: 72  Temp: 98.6 F (37 C)  Resp: 18    General: AAOx3 Cardiovascular: S!S2/RRR Respiratory: CTAB  Discharge Instructions   Discharge Instructions    Diet - low sodium heart healthy    Complete by:  As directed      Diet Carb Modified    Complete by:  As directed      Increase activity slowly    Complete by:  As directed           Discharge  Medication List as of 01/11/2015 11:19 AM    START taking these medications   Details  guaiFENesin (MUCINEX) 600 MG 12 hr tablet Take 1 tablet (600 mg total) by mouth 2 (two) times daily as needed for cough or to loosen phlegm., Starting 01/11/2015, Until Discontinued, Normal    levofloxacin (LEVAQUIN) 500 MG tablet Take 1 tablet (500 mg total) by mouth daily. For 5 days, Starting 01/11/2015, Until Discontinued, Normal      CONTINUE these medications which have NOT CHANGED   Details  clobetasol cream (TEMOVATE) 8.11 % Apply 1 application topically as needed (for itching)., Until Discontinued, Historical Med    lisinopril (PRINIVIL,ZESTRIL) 10 MG tablet Take 10 mg by mouth daily., Starting 01/03/2015, Until Discontinued, Historical Med    VICTOZA 18 MG/3ML SOPN Inject 1.8 mg into the skin daily., Starting 10/13/2014, Until Discontinued, Historical Med       Allergies  Allergen Reactions  . Ceftin [Cefuroxime Axetil] Diarrhea   Follow-up Information    Follow up with Reginia Naas, MD. Schedule an appointment as soon as possible for a visit in 1 week.   Specialty:  Family Medicine   Contact information:   [address] [place] [address] [phone]        The results of significant diagnostics from this hospitalization (including imaging, microbiology, ancillary and laboratory) are listed below for reference.    Significant Diagnostic Studies: Ct Angio Chest Pe W/cm &/or  Wo Cm  01/09/2015   CLINICAL DATA:  Severe shortness of breath. Initial presentation thecal position clinic. Diagnosed with bronchospasm and bronchitis.  EXAM: CT ANGIOGRAPHY CHEST WITH CONTRAST  TECHNIQUE: Multidetector CT imaging of the chest was performed using the standard protocol during bolus administration of intravenous contrast. Multiplanar CT image reconstructions and MIPs were obtained to evaluate the vascular anatomy.  CONTRAST:  58mL OMNIPAQUE IOHEXOL 350 MG/ML SOLN  COMPARISON:   None.  FINDINGS: Technically adequate study with moderately good opacification of the central and proximal segmental pulmonary arteries. Some limitation of contrast bolus results in poor visualization of smaller peripheral vessels. No focal filling defect is demonstrated to suggest significant central pulmonary embolus.  Normal heart size. Normal caliber thoracic aorta. No evidence of aortic dissection. Great vessel origins are patent. Coronary artery and aortic calcifications. No significant lymphadenopathy in the chest. Esophagus is decompressed. Small esophageal hiatal hernia.  Evaluation of lungs is limited due to motion artifact. There is evidence of atelectasis in the lung bases. No focal consolidation or airspace disease. Airways appear patent. No pleural effusion. No pneumothorax.  Included portions of the upper abdominal organs demonstrate diffuse fatty infiltration of the liver. Degenerative changes in the spine. No destructive bone lesions.  Review of the MIP images confirms the above findings.  IMPRESSION: No evidence of significant central pulmonary embolus although peripheral vessels are not well seen. Atelectasis in the lung bases. No focal consolidation. Diffuse fatty infiltration of the liver.   Electronically Signed   By: Lucienne Capers M.D.   On: 01/09/2015 23:20   Dg Chest Portable 1 View  01/09/2015   CLINICAL DATA:  Shortness of breath, fever.  Symptoms for 2 weeks.  EXAM: PORTABLE CHEST - 1 VIEW  COMPARISON:  03/13/2007  FINDINGS: Very low lung volumes accentuating the cardiac size. Likely mild cardiomegaly. Mild bibasilar subsegmental atelectasis, no consolidation to suggest pneumonia. No large pleural effusion or pneumothorax. No acute osseous abnormalities are seen.  IMPRESSION: Hypoventilatory chest with bibasilar subsegmental atelectasis. Likely mild cardiomegaly.   Electronically Signed   By: Jeb Levering M.D.   On: 01/09/2015 21:01    Microbiology: Recent Results (from the  past 240 hour(s))  Blood culture (routine x 2)     Status: None (Preliminary result)   Collection Time: 01/09/15  8:23 PM  Result Value Ref Range Status   Specimen Description BLOOD ARM RIGHT  Final   Special Requests BOTTLES DRAWN AEROBIC AND ANAEROBIC 10CC  Final   Culture   Final           BLOOD CULTURE RECEIVED NO GROWTH TO DATE CULTURE WILL BE HELD FOR 5 DAYS BEFORE ISSUING A FINAL NEGATIVE REPORT Performed at [facility]    Report Status PENDING  Incomplete  Blood culture (routine x 2)     Status: None (Preliminary result)   Collection Time: 01/09/15  8:30 PM  Result Value Ref Range Status   Specimen Description BLOOD ARM LEFT  Final   Special Requests BOTTLES DRAWN AEROBIC AND ANAEROBIC 10CC  Final   Culture   Final           BLOOD CULTURE RECEIVED NO GROWTH TO DATE CULTURE WILL BE HELD FOR 5 DAYS BEFORE ISSUING A FINAL NEGATIVE REPORT Performed at [facility]    Report Status PENDING  Incomplete     Labs: Basic Metabolic Panel:  Recent Labs Lab 01/09/15 2028 01/10/15 0229  NA 132* 135  K 4.3 4.2  CL 96 101  CO2 24 26  GLUCOSE 242* 206*  BUN 13 9  CREATININE 1.04 0.92  CALCIUM 9.4 8.3*   Liver Function Tests:  Recent Labs Lab 01/10/15 0229  AST 42*  ALT 57*  ALKPHOS 32*  BILITOT 0.7  PROT 6.0  ALBUMIN 3.3*   No results for input(s): LIPASE, AMYLASE in the last 168 hours. No results for input(s): AMMONIA in the last 168 hours. CBC:  Recent Labs Lab 01/09/15 2028 01/10/15 0229  WBC 12.1* 13.6*  NEUTROABS 11.1* 11.5*  HGB 16.3 14.1  HCT 46.1 41.3  MCV 88.5 88.4  PLT 180 197   Cardiac Enzymes: No results for input(s): CKTOTAL, CKMB, CKMBINDEX, TROPONINI in the last 168 hours. BNP: BNP (last 3 results)  Recent Labs  01/10/15 0229  BNP 23.0    ProBNP (last 3 results) No results for input(s): PROBNP in the last 8760 hours.  CBG:  Recent Labs Lab 01/10/15 1133 01/10/15 1626 01/10/15 2130 01/11/15 0810  01/11/15 1147  GLUCAP 137* 162* 165* 162* 100*       Signed:  Jessiah Steinhart  [facility] 01/12/2015, 2:53 PM

## 2015-01-16 LAB — CULTURE, BLOOD (ROUTINE X 2)
CULTURE: NO GROWTH
CULTURE: NO GROWTH

## 2015-05-23 ENCOUNTER — Other Ambulatory Visit: Payer: Self-pay | Admitting: Family Medicine

## 2015-05-23 DIAGNOSIS — M5416 Radiculopathy, lumbar region: Secondary | ICD-10-CM

## 2015-05-27 ENCOUNTER — Ambulatory Visit
Admission: RE | Admit: 2015-05-27 | Discharge: 2015-05-27 | Disposition: A | Discharge status: Home / Self Care | Payer: BLUE CROSS/BLUE SHIELD | Source: Ambulatory Visit | Attending: Family Medicine | Admitting: Family Medicine

## 2015-05-27 DIAGNOSIS — M5416 Radiculopathy, lumbar region: Secondary | ICD-10-CM

## 2015-06-01 ENCOUNTER — Other Ambulatory Visit: Payer: BLUE CROSS/BLUE SHIELD

## 2016-03-21 IMAGING — MR MR LUMBAR SPINE W/O CM
4 of 5 series · 25 of 48 positions shown · non-contrast
Comparison: None.

CLINICAL DATA: Pain, numbness in the left leg from the hip to the
foot. On off for 6 months.

EXAM:
MRI LUMBAR SPINE WITHOUT CONTRAST
TECHNIQUE: Multiplanar, multisequence MR imaging of the lumbar spine was
performed. No intravenous contrast was administered.

[Series 4: T1 · sagittal · 4.0mm · 0.55mm/px · 6 of 13 slices shown (1 of 2)]
[im 1/13]
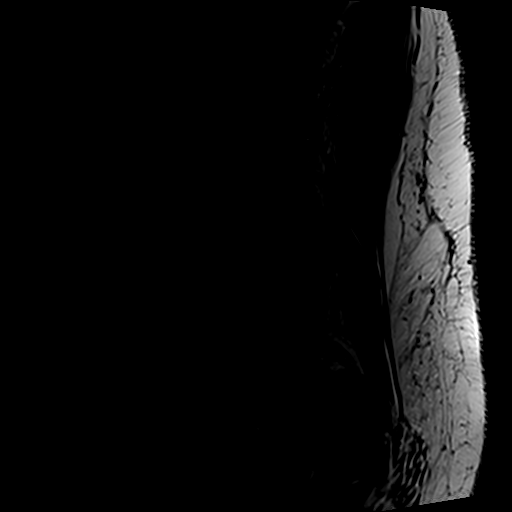
[im 3/13]
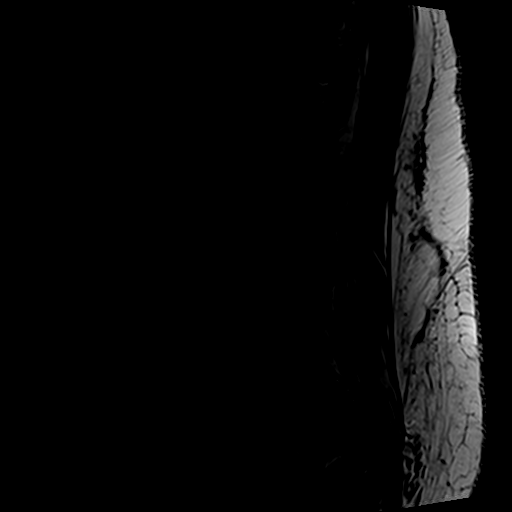
[im 5/13]
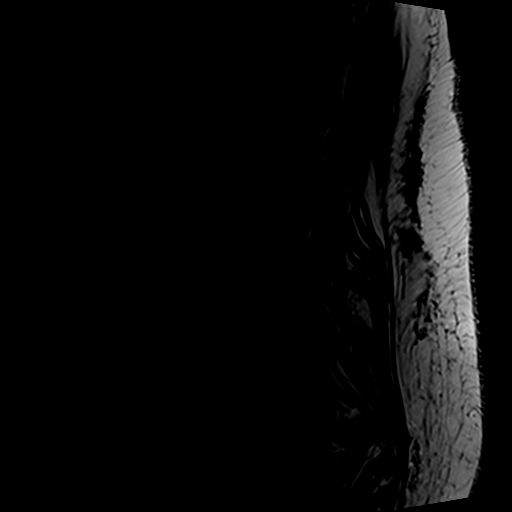
[im 8/13]
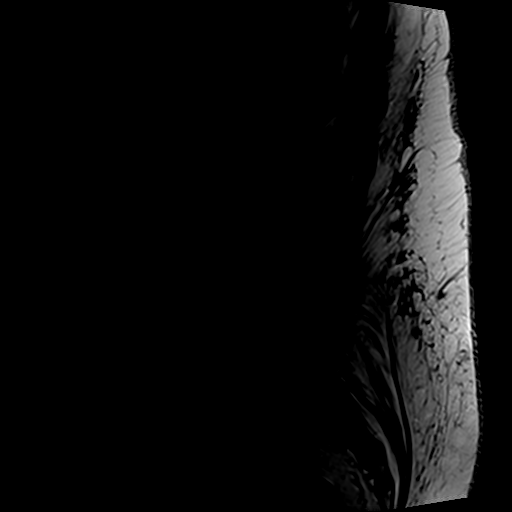
[im 10/13]
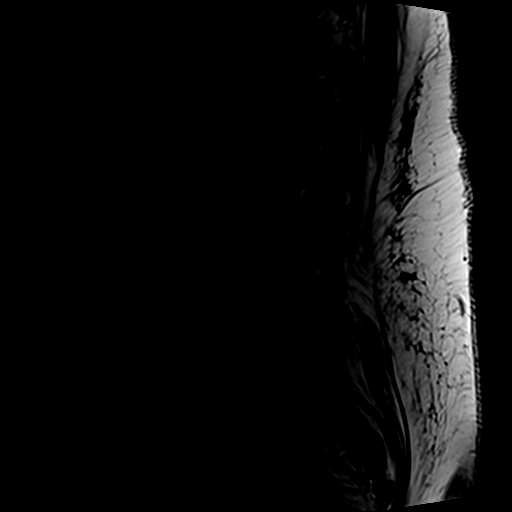
[im 13/13]
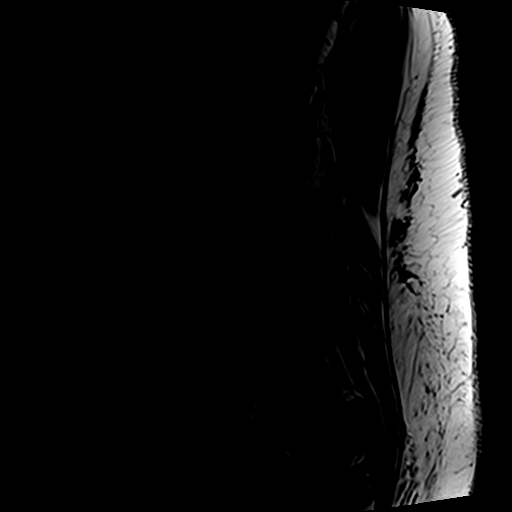

[Series 5: T2 post-contrast · sagittal · 4.0mm · 0.55mm/px · 6 of 13 slices shown]
[im 1/13]
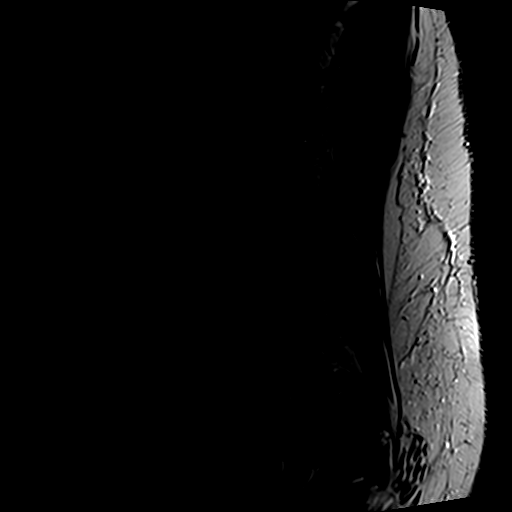
[im 3/13]
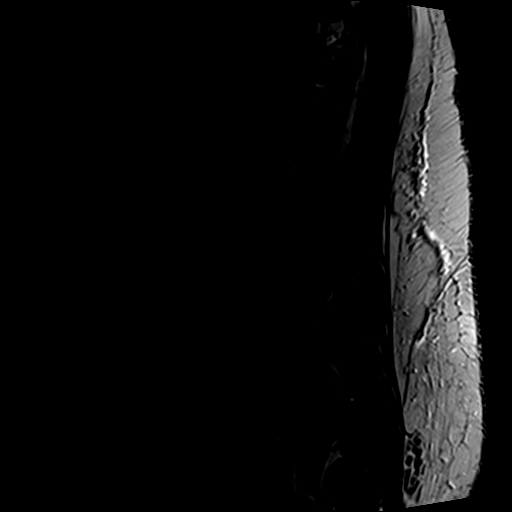
[im 5/13]
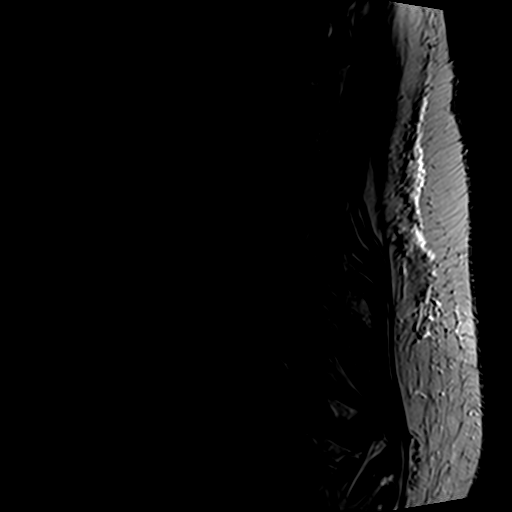
[im 8/13]
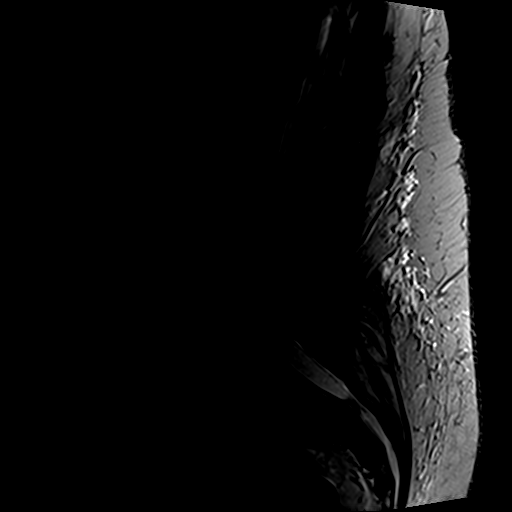
[im 10/13]
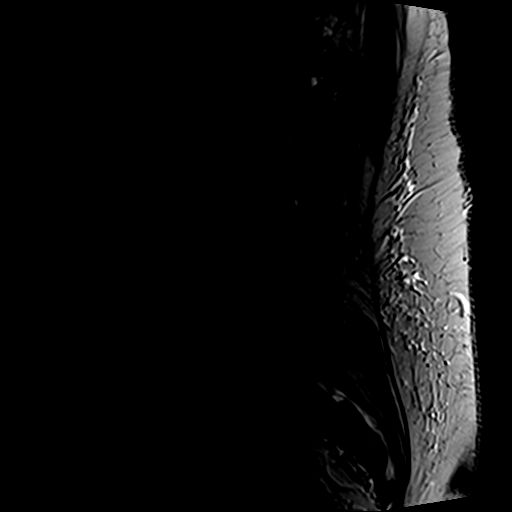
[im 13/13]
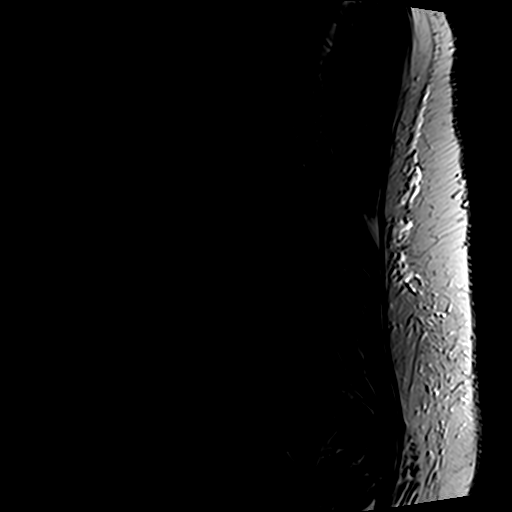

[Series 6: T2 · axial · 4.0mm · 0.70mm/px · z∈[-80,+102]mm · 9 of 32 slices shown]
[im 1/32]
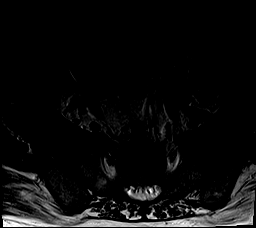
[im 5/32]
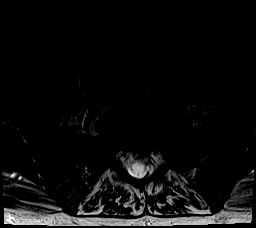
[im 9/32]
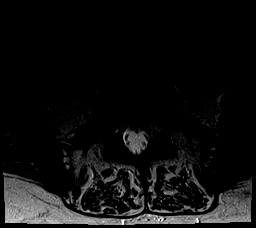
[im 14/32]
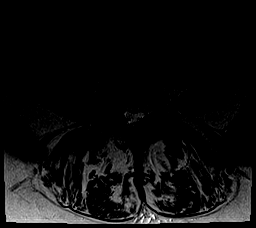
[im 16/32]
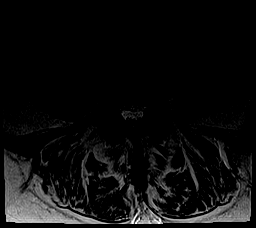
[im 18/32]
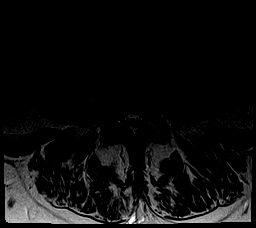
[im 23/32]
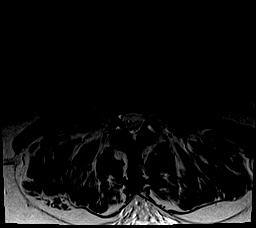
[im 27/32]
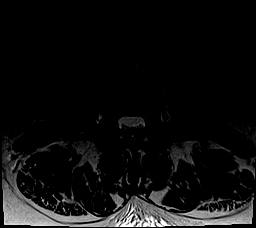
[im 32/32]
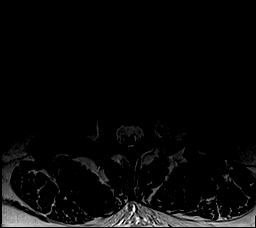

[Series 7: T1 · axial · 4.0mm · 0.35mm/px · z∈[-80,+78]mm · 4 of 32 slices shown (2 of 2)]
[im 1/32]
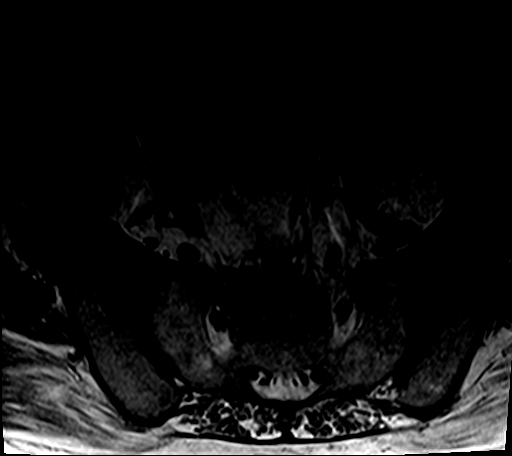
[im 5/32]
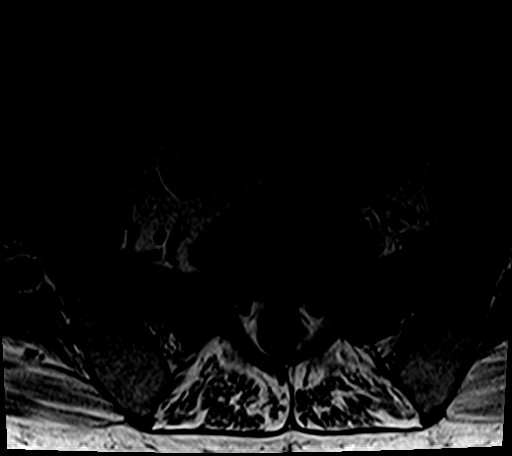
[im 16/32]
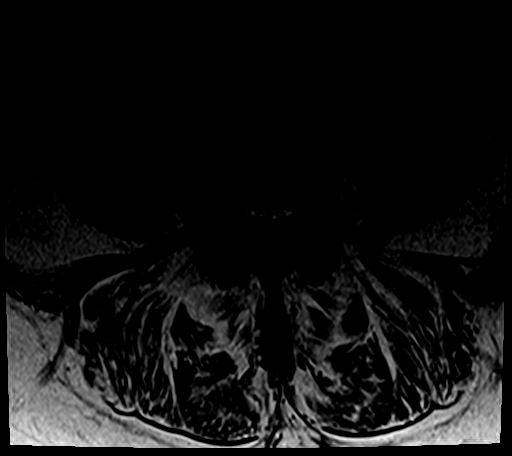
[im 27/32]
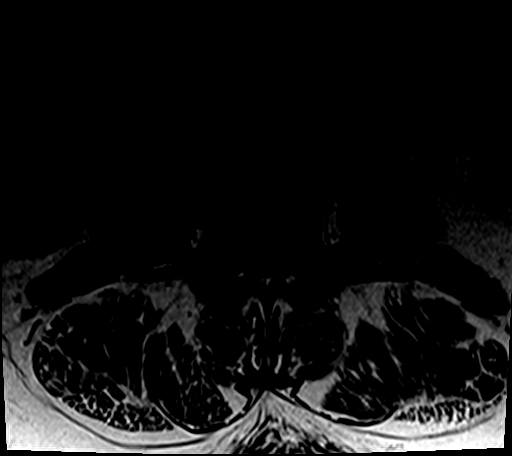

[25 of 48 positions shown; findings below may reference images not displayed]

FINDINGS: The vertebral bodies of the lumbar spine are normal in size. The
vertebral bodies of the lumbar spine are normal in alignment. There
is normal bone marrow signal demonstrated throughout the vertebra.
The intervertebral disc spaces are well-maintained.

The spinal cord is normal in signal and contour. The cord terminates
normally at L1-2 . The nerve roots of the cauda equina and the filum
terminale are normal.

The visualized portions of the SI joints are unremarkable.

The imaged intra-abdominal contents are unremarkable.

T12-L1: Mild broad-based disc bulge. No evidence of neural foraminal
stenosis. No central canal stenosis.

L1-L2: No significant disc bulge. No evidence of neural foraminal
stenosis. No central canal stenosis.

L2-L3: Mild broad-based disc bulge. Mild bilateral facet
arthropathy. No evidence of neural foraminal stenosis. No central
canal stenosis.

L3-L4: Mild broad-based disc bulge. Mild bilateral facet
arthropathy. Mild bilateral lateral recess stenosis. No evidence of
neural foraminal stenosis. Mild spinal stenosis.

L4-L5: Right lateral disc protrusion abutting the extra foraminal
right L4 nerve root. Severe bilateral facet arthropathy with
ligamentum flavum infolding resulting in bilateral lateral recess
narrowing and mild spinal stenosis. Mild right foraminal stenosis.

L5-S1: No significant disc bulge. No evidence of neural foraminal
stenosis. No central canal stenosis.
IMPRESSION: 1. At L4-5 there is a right lateral disc protrusion abutting the
extra foraminal right L4 nerve root. Severe bilateral facet
arthropathy with ligamentum flavum infolding resulting in bilateral
lateral recess narrowing and mild spinal stenosis. Mild right
foraminal stenosis.
2. At L3-4 there is a mild broad-based disc bulge. Mild bilateral
facet arthropathy. Mild bilateral lateral recess stenosis.

## 2016-05-24 DIAGNOSIS — E1165 Type 2 diabetes mellitus with hyperglycemia: Secondary | ICD-10-CM | POA: Diagnosis not present

## 2016-05-24 DIAGNOSIS — F329 Major depressive disorder, single episode, unspecified: Secondary | ICD-10-CM | POA: Diagnosis not present

## 2016-05-24 DIAGNOSIS — I10 Essential (primary) hypertension: Secondary | ICD-10-CM | POA: Diagnosis not present

## 2016-05-24 DIAGNOSIS — E669 Obesity, unspecified: Secondary | ICD-10-CM | POA: Diagnosis not present

## 2016-11-25 DIAGNOSIS — E119 Type 2 diabetes mellitus without complications: Secondary | ICD-10-CM | POA: Diagnosis not present

## 2016-11-25 DIAGNOSIS — F329 Major depressive disorder, single episode, unspecified: Secondary | ICD-10-CM | POA: Diagnosis not present

## 2016-11-25 DIAGNOSIS — I10 Essential (primary) hypertension: Secondary | ICD-10-CM | POA: Diagnosis not present

## 2017-03-08 DIAGNOSIS — D225 Melanocytic nevi of trunk: Secondary | ICD-10-CM | POA: Diagnosis not present

## 2017-03-08 DIAGNOSIS — L821 Other seborrheic keratosis: Secondary | ICD-10-CM | POA: Diagnosis not present

## 2017-03-08 DIAGNOSIS — L814 Other melanin hyperpigmentation: Secondary | ICD-10-CM | POA: Diagnosis not present

## 2017-03-08 DIAGNOSIS — D1800 Hemangioma unspecified site: Secondary | ICD-10-CM | POA: Diagnosis not present

## 2017-04-26 DIAGNOSIS — I10 Essential (primary) hypertension: Secondary | ICD-10-CM | POA: Diagnosis not present

## 2017-04-26 DIAGNOSIS — F329 Major depressive disorder, single episode, unspecified: Secondary | ICD-10-CM | POA: Diagnosis not present

## 2017-04-26 DIAGNOSIS — K219 Gastro-esophageal reflux disease without esophagitis: Secondary | ICD-10-CM | POA: Diagnosis not present

## 2017-04-26 DIAGNOSIS — Z125 Encounter for screening for malignant neoplasm of prostate: Secondary | ICD-10-CM | POA: Diagnosis not present

## 2017-04-26 DIAGNOSIS — Z0000 Encounter for general adult medical examination without abnormal findings: Secondary | ICD-10-CM | POA: Diagnosis not present

## 2017-04-26 DIAGNOSIS — E119 Type 2 diabetes mellitus without complications: Secondary | ICD-10-CM | POA: Diagnosis not present

## 2017-05-17 DIAGNOSIS — G4733 Obstructive sleep apnea (adult) (pediatric): Secondary | ICD-10-CM | POA: Diagnosis not present

## 2017-05-17 DIAGNOSIS — G4730 Sleep apnea, unspecified: Secondary | ICD-10-CM | POA: Diagnosis not present

## 2017-05-30 DIAGNOSIS — K621 Rectal polyp: Secondary | ICD-10-CM | POA: Diagnosis not present

## 2017-05-30 DIAGNOSIS — Z538 Procedure and treatment not carried out for other reasons: Secondary | ICD-10-CM | POA: Diagnosis not present

## 2017-05-30 DIAGNOSIS — K648 Other hemorrhoids: Secondary | ICD-10-CM | POA: Diagnosis not present

## 2017-05-30 DIAGNOSIS — Z1211 Encounter for screening for malignant neoplasm of colon: Secondary | ICD-10-CM | POA: Diagnosis not present

## 2017-05-30 DIAGNOSIS — D128 Benign neoplasm of rectum: Secondary | ICD-10-CM | POA: Diagnosis not present

## 2017-08-22 DIAGNOSIS — G4730 Sleep apnea, unspecified: Secondary | ICD-10-CM | POA: Diagnosis not present

## 2017-08-22 DIAGNOSIS — G4733 Obstructive sleep apnea (adult) (pediatric): Secondary | ICD-10-CM | POA: Diagnosis not present

## 2017-08-23 DIAGNOSIS — G4733 Obstructive sleep apnea (adult) (pediatric): Secondary | ICD-10-CM | POA: Diagnosis not present

## 2017-10-03 DIAGNOSIS — E119 Type 2 diabetes mellitus without complications: Secondary | ICD-10-CM | POA: Diagnosis not present

## 2017-10-03 DIAGNOSIS — F325 Major depressive disorder, single episode, in full remission: Secondary | ICD-10-CM | POA: Diagnosis not present

## 2017-10-03 DIAGNOSIS — I10 Essential (primary) hypertension: Secondary | ICD-10-CM | POA: Diagnosis not present

## 2017-10-03 DIAGNOSIS — E669 Obesity, unspecified: Secondary | ICD-10-CM | POA: Diagnosis not present

## 2017-12-04 DIAGNOSIS — R05 Cough: Secondary | ICD-10-CM | POA: Diagnosis not present

## 2017-12-04 DIAGNOSIS — J069 Acute upper respiratory infection, unspecified: Secondary | ICD-10-CM | POA: Diagnosis not present

## 2017-12-09 DIAGNOSIS — G4733 Obstructive sleep apnea (adult) (pediatric): Secondary | ICD-10-CM | POA: Diagnosis not present

## 2018-01-06 DIAGNOSIS — G4733 Obstructive sleep apnea (adult) (pediatric): Secondary | ICD-10-CM | POA: Diagnosis not present

## 2018-02-01 DIAGNOSIS — L814 Other melanin hyperpigmentation: Secondary | ICD-10-CM | POA: Diagnosis not present

## 2018-02-01 DIAGNOSIS — L821 Other seborrheic keratosis: Secondary | ICD-10-CM | POA: Diagnosis not present

## 2018-02-01 DIAGNOSIS — D225 Melanocytic nevi of trunk: Secondary | ICD-10-CM | POA: Diagnosis not present

## 2018-02-01 DIAGNOSIS — L570 Actinic keratosis: Secondary | ICD-10-CM | POA: Diagnosis not present

## 2018-02-01 DIAGNOSIS — D1800 Hemangioma unspecified site: Secondary | ICD-10-CM | POA: Diagnosis not present

## 2018-02-06 DIAGNOSIS — G4733 Obstructive sleep apnea (adult) (pediatric): Secondary | ICD-10-CM | POA: Diagnosis not present

## 2018-02-23 DIAGNOSIS — G4733 Obstructive sleep apnea (adult) (pediatric): Secondary | ICD-10-CM | POA: Diagnosis not present

## 2018-03-08 DIAGNOSIS — G4733 Obstructive sleep apnea (adult) (pediatric): Secondary | ICD-10-CM | POA: Diagnosis not present

## 2018-04-27 DIAGNOSIS — E669 Obesity, unspecified: Secondary | ICD-10-CM | POA: Diagnosis not present

## 2018-04-27 DIAGNOSIS — F325 Major depressive disorder, single episode, in full remission: Secondary | ICD-10-CM | POA: Diagnosis not present

## 2018-04-27 DIAGNOSIS — Z0000 Encounter for general adult medical examination without abnormal findings: Secondary | ICD-10-CM | POA: Diagnosis not present

## 2018-04-27 DIAGNOSIS — Z125 Encounter for screening for malignant neoplasm of prostate: Secondary | ICD-10-CM | POA: Diagnosis not present

## 2018-04-27 DIAGNOSIS — I10 Essential (primary) hypertension: Secondary | ICD-10-CM | POA: Diagnosis not present

## 2018-04-27 DIAGNOSIS — E1169 Type 2 diabetes mellitus with other specified complication: Secondary | ICD-10-CM | POA: Diagnosis not present

## 2018-04-27 DIAGNOSIS — Z23 Encounter for immunization: Secondary | ICD-10-CM | POA: Diagnosis not present

## 2018-08-11 DIAGNOSIS — J209 Acute bronchitis, unspecified: Secondary | ICD-10-CM | POA: Diagnosis not present

## 2018-08-28 DIAGNOSIS — R05 Cough: Secondary | ICD-10-CM | POA: Diagnosis not present

## 2019-04-24 DIAGNOSIS — G4733 Obstructive sleep apnea (adult) (pediatric): Secondary | ICD-10-CM | POA: Diagnosis not present

## 2019-04-30 DIAGNOSIS — I10 Essential (primary) hypertension: Secondary | ICD-10-CM | POA: Diagnosis not present

## 2019-04-30 DIAGNOSIS — E669 Obesity, unspecified: Secondary | ICD-10-CM | POA: Diagnosis not present

## 2019-04-30 DIAGNOSIS — H6121 Impacted cerumen, right ear: Secondary | ICD-10-CM | POA: Diagnosis not present

## 2019-04-30 DIAGNOSIS — F325 Major depressive disorder, single episode, in full remission: Secondary | ICD-10-CM | POA: Diagnosis not present

## 2019-04-30 DIAGNOSIS — Z0000 Encounter for general adult medical examination without abnormal findings: Secondary | ICD-10-CM | POA: Diagnosis not present

## 2019-04-30 DIAGNOSIS — E1169 Type 2 diabetes mellitus with other specified complication: Secondary | ICD-10-CM | POA: Diagnosis not present

## 2019-04-30 DIAGNOSIS — Z6841 Body Mass Index (BMI) 40.0 and over, adult: Secondary | ICD-10-CM | POA: Diagnosis not present

## 2019-04-30 DIAGNOSIS — Z125 Encounter for screening for malignant neoplasm of prostate: Secondary | ICD-10-CM | POA: Diagnosis not present

## 2019-04-30 DIAGNOSIS — Z23 Encounter for immunization: Secondary | ICD-10-CM | POA: Diagnosis not present

## 2019-05-08 DIAGNOSIS — G4733 Obstructive sleep apnea (adult) (pediatric): Secondary | ICD-10-CM | POA: Diagnosis not present

## 2019-05-21 DIAGNOSIS — L570 Actinic keratosis: Secondary | ICD-10-CM | POA: Diagnosis not present

## 2019-05-21 DIAGNOSIS — D225 Melanocytic nevi of trunk: Secondary | ICD-10-CM | POA: Diagnosis not present

## 2019-05-21 DIAGNOSIS — L814 Other melanin hyperpigmentation: Secondary | ICD-10-CM | POA: Diagnosis not present

## 2019-05-21 DIAGNOSIS — L821 Other seborrheic keratosis: Secondary | ICD-10-CM | POA: Diagnosis not present

## 2019-05-21 DIAGNOSIS — Q825 Congenital non-neoplastic nevus: Secondary | ICD-10-CM | POA: Diagnosis not present

## 2019-11-07 DIAGNOSIS — G4730 Sleep apnea, unspecified: Secondary | ICD-10-CM | POA: Diagnosis not present

## 2019-11-07 DIAGNOSIS — E1169 Type 2 diabetes mellitus with other specified complication: Secondary | ICD-10-CM | POA: Diagnosis not present

## 2019-11-07 DIAGNOSIS — F325 Major depressive disorder, single episode, in full remission: Secondary | ICD-10-CM | POA: Diagnosis not present

## 2019-11-07 DIAGNOSIS — I10 Essential (primary) hypertension: Secondary | ICD-10-CM | POA: Diagnosis not present

## 2020-04-24 DIAGNOSIS — G4733 Obstructive sleep apnea (adult) (pediatric): Secondary | ICD-10-CM | POA: Diagnosis not present

## 2020-05-06 DIAGNOSIS — E1169 Type 2 diabetes mellitus with other specified complication: Secondary | ICD-10-CM | POA: Diagnosis not present

## 2020-05-06 DIAGNOSIS — Z23 Encounter for immunization: Secondary | ICD-10-CM | POA: Diagnosis not present

## 2020-05-06 DIAGNOSIS — F325 Major depressive disorder, single episode, in full remission: Secondary | ICD-10-CM | POA: Diagnosis not present

## 2020-05-06 DIAGNOSIS — E669 Obesity, unspecified: Secondary | ICD-10-CM | POA: Diagnosis not present

## 2020-05-06 DIAGNOSIS — I10 Essential (primary) hypertension: Secondary | ICD-10-CM | POA: Diagnosis not present

## 2020-05-06 DIAGNOSIS — Z125 Encounter for screening for malignant neoplasm of prostate: Secondary | ICD-10-CM | POA: Diagnosis not present

## 2020-05-06 DIAGNOSIS — Z13220 Encounter for screening for lipoid disorders: Secondary | ICD-10-CM | POA: Diagnosis not present

## 2020-05-06 DIAGNOSIS — Z0000 Encounter for general adult medical examination without abnormal findings: Secondary | ICD-10-CM | POA: Diagnosis not present

## 2020-06-05 DIAGNOSIS — Z86010 Personal history of colonic polyps: Secondary | ICD-10-CM | POA: Diagnosis not present

## 2020-06-05 DIAGNOSIS — K635 Polyp of colon: Secondary | ICD-10-CM | POA: Diagnosis not present

## 2020-06-05 DIAGNOSIS — K5730 Diverticulosis of large intestine without perforation or abscess without bleeding: Secondary | ICD-10-CM | POA: Diagnosis not present

## 2020-06-05 DIAGNOSIS — K621 Rectal polyp: Secondary | ICD-10-CM | POA: Diagnosis not present

## 2020-06-05 DIAGNOSIS — Z1211 Encounter for screening for malignant neoplasm of colon: Secondary | ICD-10-CM | POA: Diagnosis not present

## 2020-06-17 DIAGNOSIS — D225 Melanocytic nevi of trunk: Secondary | ICD-10-CM | POA: Diagnosis not present

## 2020-06-17 DIAGNOSIS — Q825 Congenital non-neoplastic nevus: Secondary | ICD-10-CM | POA: Diagnosis not present

## 2020-06-17 DIAGNOSIS — L570 Actinic keratosis: Secondary | ICD-10-CM | POA: Diagnosis not present

## 2020-06-17 DIAGNOSIS — L821 Other seborrheic keratosis: Secondary | ICD-10-CM | POA: Diagnosis not present

## 2020-06-17 DIAGNOSIS — L814 Other melanin hyperpigmentation: Secondary | ICD-10-CM | POA: Diagnosis not present

## 2020-08-05 DIAGNOSIS — Z23 Encounter for immunization: Secondary | ICD-10-CM | POA: Diagnosis not present

## 2020-08-11 DIAGNOSIS — M7702 Medial epicondylitis, left elbow: Secondary | ICD-10-CM | POA: Diagnosis not present

## 2020-11-12 DIAGNOSIS — I10 Essential (primary) hypertension: Secondary | ICD-10-CM | POA: Diagnosis not present

## 2020-11-12 DIAGNOSIS — F325 Major depressive disorder, single episode, in full remission: Secondary | ICD-10-CM | POA: Diagnosis not present

## 2020-11-12 DIAGNOSIS — E1165 Type 2 diabetes mellitus with hyperglycemia: Secondary | ICD-10-CM | POA: Diagnosis not present

## 2020-11-12 DIAGNOSIS — K219 Gastro-esophageal reflux disease without esophagitis: Secondary | ICD-10-CM | POA: Diagnosis not present

## 2021-04-28 DIAGNOSIS — G4722 Circadian rhythm sleep disorder, advanced sleep phase type: Secondary | ICD-10-CM | POA: Diagnosis not present

## 2021-04-28 DIAGNOSIS — G4733 Obstructive sleep apnea (adult) (pediatric): Secondary | ICD-10-CM | POA: Diagnosis not present

## 2021-05-20 DIAGNOSIS — Z125 Encounter for screening for malignant neoplasm of prostate: Secondary | ICD-10-CM | POA: Diagnosis not present

## 2021-05-20 DIAGNOSIS — K219 Gastro-esophageal reflux disease without esophagitis: Secondary | ICD-10-CM | POA: Diagnosis not present

## 2021-05-20 DIAGNOSIS — E669 Obesity, unspecified: Secondary | ICD-10-CM | POA: Diagnosis not present

## 2021-05-20 DIAGNOSIS — Z0000 Encounter for general adult medical examination without abnormal findings: Secondary | ICD-10-CM | POA: Diagnosis not present

## 2021-05-20 DIAGNOSIS — E1169 Type 2 diabetes mellitus with other specified complication: Secondary | ICD-10-CM | POA: Diagnosis not present

## 2021-05-20 DIAGNOSIS — I10 Essential (primary) hypertension: Secondary | ICD-10-CM | POA: Diagnosis not present

## 2021-06-22 DIAGNOSIS — D225 Melanocytic nevi of trunk: Secondary | ICD-10-CM | POA: Diagnosis not present

## 2021-06-22 DIAGNOSIS — L814 Other melanin hyperpigmentation: Secondary | ICD-10-CM | POA: Diagnosis not present

## 2021-06-22 DIAGNOSIS — L570 Actinic keratosis: Secondary | ICD-10-CM | POA: Diagnosis not present

## 2021-06-22 DIAGNOSIS — L821 Other seborrheic keratosis: Secondary | ICD-10-CM | POA: Diagnosis not present

## 2021-06-22 DIAGNOSIS — Q825 Congenital non-neoplastic nevus: Secondary | ICD-10-CM | POA: Diagnosis not present

## 2021-11-10 DIAGNOSIS — M5451 Vertebrogenic low back pain: Secondary | ICD-10-CM | POA: Diagnosis not present

## 2021-11-25 DIAGNOSIS — F325 Major depressive disorder, single episode, in full remission: Secondary | ICD-10-CM | POA: Diagnosis not present

## 2021-11-25 DIAGNOSIS — I10 Essential (primary) hypertension: Secondary | ICD-10-CM | POA: Diagnosis not present

## 2021-11-25 DIAGNOSIS — M25561 Pain in right knee: Secondary | ICD-10-CM | POA: Diagnosis not present

## 2021-11-25 DIAGNOSIS — E1165 Type 2 diabetes mellitus with hyperglycemia: Secondary | ICD-10-CM | POA: Diagnosis not present

## 2022-02-23 DIAGNOSIS — E1165 Type 2 diabetes mellitus with hyperglycemia: Secondary | ICD-10-CM | POA: Diagnosis not present

## 2022-02-23 DIAGNOSIS — I10 Essential (primary) hypertension: Secondary | ICD-10-CM | POA: Diagnosis not present

## 2022-02-26 DIAGNOSIS — M1711 Unilateral primary osteoarthritis, right knee: Secondary | ICD-10-CM | POA: Diagnosis not present

## 2022-04-23 DIAGNOSIS — M5441 Lumbago with sciatica, right side: Secondary | ICD-10-CM | POA: Diagnosis not present

## 2022-04-23 DIAGNOSIS — M9904 Segmental and somatic dysfunction of sacral region: Secondary | ICD-10-CM | POA: Diagnosis not present

## 2022-04-23 DIAGNOSIS — M9906 Segmental and somatic dysfunction of lower extremity: Secondary | ICD-10-CM | POA: Diagnosis not present

## 2022-04-23 DIAGNOSIS — M546 Pain in thoracic spine: Secondary | ICD-10-CM | POA: Diagnosis not present

## 2022-04-23 DIAGNOSIS — M9905 Segmental and somatic dysfunction of pelvic region: Secondary | ICD-10-CM | POA: Diagnosis not present

## 2022-04-30 DIAGNOSIS — M9905 Segmental and somatic dysfunction of pelvic region: Secondary | ICD-10-CM | POA: Diagnosis not present

## 2022-04-30 DIAGNOSIS — M546 Pain in thoracic spine: Secondary | ICD-10-CM | POA: Diagnosis not present

## 2022-04-30 DIAGNOSIS — M9904 Segmental and somatic dysfunction of sacral region: Secondary | ICD-10-CM | POA: Diagnosis not present

## 2022-04-30 DIAGNOSIS — M9906 Segmental and somatic dysfunction of lower extremity: Secondary | ICD-10-CM | POA: Diagnosis not present

## 2022-06-17 DIAGNOSIS — Z0000 Encounter for general adult medical examination without abnormal findings: Secondary | ICD-10-CM | POA: Diagnosis not present

## 2022-06-17 DIAGNOSIS — G4733 Obstructive sleep apnea (adult) (pediatric): Secondary | ICD-10-CM | POA: Diagnosis not present

## 2022-06-17 DIAGNOSIS — H35033 Hypertensive retinopathy, bilateral: Secondary | ICD-10-CM | POA: Diagnosis not present

## 2022-06-17 DIAGNOSIS — E1165 Type 2 diabetes mellitus with hyperglycemia: Secondary | ICD-10-CM | POA: Diagnosis not present

## 2022-06-17 DIAGNOSIS — I10 Essential (primary) hypertension: Secondary | ICD-10-CM | POA: Diagnosis not present

## 2022-06-17 DIAGNOSIS — Z125 Encounter for screening for malignant neoplasm of prostate: Secondary | ICD-10-CM | POA: Diagnosis not present

## 2022-06-28 DIAGNOSIS — Q825 Congenital non-neoplastic nevus: Secondary | ICD-10-CM | POA: Diagnosis not present

## 2022-06-28 DIAGNOSIS — D225 Melanocytic nevi of trunk: Secondary | ICD-10-CM | POA: Diagnosis not present

## 2022-06-28 DIAGNOSIS — L821 Other seborrheic keratosis: Secondary | ICD-10-CM | POA: Diagnosis not present

## 2022-06-28 DIAGNOSIS — L814 Other melanin hyperpigmentation: Secondary | ICD-10-CM | POA: Diagnosis not present

## 2022-07-13 DIAGNOSIS — G4733 Obstructive sleep apnea (adult) (pediatric): Secondary | ICD-10-CM | POA: Diagnosis not present

## 2022-12-22 DIAGNOSIS — F3341 Major depressive disorder, recurrent, in partial remission: Secondary | ICD-10-CM | POA: Diagnosis not present

## 2022-12-22 DIAGNOSIS — M6281 Muscle weakness (generalized): Secondary | ICD-10-CM | POA: Diagnosis not present

## 2022-12-22 DIAGNOSIS — G4730 Sleep apnea, unspecified: Secondary | ICD-10-CM | POA: Diagnosis not present

## 2022-12-22 DIAGNOSIS — I10 Essential (primary) hypertension: Secondary | ICD-10-CM | POA: Diagnosis not present

## 2022-12-22 DIAGNOSIS — E1165 Type 2 diabetes mellitus with hyperglycemia: Secondary | ICD-10-CM | POA: Diagnosis not present

## 2023-06-27 DIAGNOSIS — Z09 Encounter for follow-up examination after completed treatment for conditions other than malignant neoplasm: Secondary | ICD-10-CM | POA: Diagnosis not present

## 2023-06-27 DIAGNOSIS — Z1211 Encounter for screening for malignant neoplasm of colon: Secondary | ICD-10-CM | POA: Diagnosis not present

## 2023-06-27 DIAGNOSIS — K6289 Other specified diseases of anus and rectum: Secondary | ICD-10-CM | POA: Diagnosis not present

## 2023-06-27 DIAGNOSIS — E119 Type 2 diabetes mellitus without complications: Secondary | ICD-10-CM | POA: Diagnosis not present

## 2023-06-27 DIAGNOSIS — Z86010 Personal history of colonic polyps: Secondary | ICD-10-CM | POA: Diagnosis not present

## 2023-06-27 DIAGNOSIS — K6389 Other specified diseases of intestine: Secondary | ICD-10-CM | POA: Diagnosis not present

## 2023-07-07 DIAGNOSIS — Z0000 Encounter for general adult medical examination without abnormal findings: Secondary | ICD-10-CM | POA: Diagnosis not present

## 2023-07-07 DIAGNOSIS — Z23 Encounter for immunization: Secondary | ICD-10-CM | POA: Diagnosis not present

## 2023-07-07 DIAGNOSIS — K219 Gastro-esophageal reflux disease without esophagitis: Secondary | ICD-10-CM | POA: Diagnosis not present

## 2023-07-07 DIAGNOSIS — Z125 Encounter for screening for malignant neoplasm of prostate: Secondary | ICD-10-CM | POA: Diagnosis not present

## 2023-07-07 DIAGNOSIS — E1165 Type 2 diabetes mellitus with hyperglycemia: Secondary | ICD-10-CM | POA: Diagnosis not present

## 2023-07-07 DIAGNOSIS — I10 Essential (primary) hypertension: Secondary | ICD-10-CM | POA: Diagnosis not present

## 2023-07-11 DIAGNOSIS — L821 Other seborrheic keratosis: Secondary | ICD-10-CM | POA: Diagnosis not present

## 2023-07-11 DIAGNOSIS — L814 Other melanin hyperpigmentation: Secondary | ICD-10-CM | POA: Diagnosis not present

## 2023-07-11 DIAGNOSIS — L570 Actinic keratosis: Secondary | ICD-10-CM | POA: Diagnosis not present

## 2023-07-11 DIAGNOSIS — D225 Melanocytic nevi of trunk: Secondary | ICD-10-CM | POA: Diagnosis not present

## 2023-07-11 DIAGNOSIS — Q825 Congenital non-neoplastic nevus: Secondary | ICD-10-CM | POA: Diagnosis not present

## 2023-11-29 DIAGNOSIS — M7061 Trochanteric bursitis, right hip: Secondary | ICD-10-CM | POA: Diagnosis not present

## 2023-12-26 DIAGNOSIS — R0981 Nasal congestion: Secondary | ICD-10-CM | POA: Diagnosis not present

## 2023-12-26 DIAGNOSIS — R52 Pain, unspecified: Secondary | ICD-10-CM | POA: Diagnosis not present

## 2023-12-26 DIAGNOSIS — R5383 Other fatigue: Secondary | ICD-10-CM | POA: Diagnosis not present

## 2023-12-26 DIAGNOSIS — R509 Fever, unspecified: Secondary | ICD-10-CM | POA: Diagnosis not present

## 2023-12-26 DIAGNOSIS — R059 Cough, unspecified: Secondary | ICD-10-CM | POA: Diagnosis not present

## 2024-01-02 DIAGNOSIS — M25562 Pain in left knee: Secondary | ICD-10-CM | POA: Diagnosis not present

## 2024-01-02 DIAGNOSIS — M25561 Pain in right knee: Secondary | ICD-10-CM | POA: Diagnosis not present

## 2024-01-02 DIAGNOSIS — G8929 Other chronic pain: Secondary | ICD-10-CM | POA: Diagnosis not present

## 2024-01-04 DIAGNOSIS — G4733 Obstructive sleep apnea (adult) (pediatric): Secondary | ICD-10-CM | POA: Diagnosis not present

## 2024-01-04 DIAGNOSIS — E1165 Type 2 diabetes mellitus with hyperglycemia: Secondary | ICD-10-CM | POA: Diagnosis not present

## 2024-01-04 DIAGNOSIS — E669 Obesity, unspecified: Secondary | ICD-10-CM | POA: Diagnosis not present

## 2024-01-04 DIAGNOSIS — E1169 Type 2 diabetes mellitus with other specified complication: Secondary | ICD-10-CM | POA: Diagnosis not present

## 2024-01-04 DIAGNOSIS — I10 Essential (primary) hypertension: Secondary | ICD-10-CM | POA: Diagnosis not present

## 2024-01-16 ENCOUNTER — Telehealth (HOSPITAL_BASED_OUTPATIENT_CLINIC_OR_DEPARTMENT_OTHER): Payer: Self-pay | Admitting: Physical Therapy

## 2024-01-16 NOTE — Telephone Encounter (Signed)
 Called and spoke to patient to remind patient of upcoming physical therapy evaluation appointment. Pt confirmed appt and will be in attendance.

## 2024-01-17 NOTE — Therapy (Signed)
 OUTPATIENT PHYSICAL THERAPY LOWER EXTREMITY EVALUATION   Patient Name: Marc Serrano MRN: [id number] DOB:[birth year], 73 y.o., male Today's Date: 01/18/2024  END OF SESSION:  PT End of Session - 01/18/24 1033     Visit Number 1    Number of Visits 16    Date for PT Re-Evaluation 03/23/24    Authorization Type BCBS    PT Start Time 0720    PT Stop Time 0755    PT Time Calculation (min) 35 min    Activity Tolerance Patient tolerated treatment well             Past Medical History:  Diagnosis Date   Asthma    Cancer (HCC)    Diabetes mellitus without complication (HCC)    Hypertension    Past Surgical History:  Procedure Laterality Date   APPENDECTOMY     HEMORRHOID SURGERY     sphincterectomy rectal     TONSILLECTOMY     Patient Active Problem List   Diagnosis Date Noted   Acute bronchitis 01/11/2015   SIRS (systemic inflammatory response syndrome) (HCC) 01/10/2015   Diabetes mellitus type 2, uncontrolled 01/10/2015   Hypertension 01/10/2015    PCP: Merri Brunette, MD  REFERRING PROVIDER: Hurman Horn, MD   REFERRING DIAG: M70.61 (ICD-10-CM) - Trochanteric bursitis of right hip   THERAPY DIAG:  Right hip pain  Muscle weakness (generalized)  Other abnormalities of gait and mobility  Rationale for Evaluation and Treatment: Rehabilitation  ONSET DATE: 2-3 years  SUBJECTIVE:   SUBJECTIVE STATEMENT: Injections in knees a few weeks ago. Hips and knees were hurting bad. Injections cleared up my hips better.  4-5 years ago I was walking 5 miles a day. Walking daily 01-4999 steps a day trying to get back up to 10000.  PERTINENT HISTORY: Bilateral knee pain PAIN:  Are you having pain? Yes: NPRS scale: R hip current 0/10; worst 4/10; Pain location: right hip and lateral aspect of thigh/IT band Pain description: dull ache Aggravating factors: walking > 12 minutes Relieving factors: rest, sitting, OTC med  PRECAUTIONS: None  RED FLAGS: None   WEIGHT  BEARING RESTRICTIONS: No  FALLS:  Has patient fallen in last 6 months? 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  DIAGNOSTIC FINDINGS: nothing recent  PATIENT SURVEYS:  LEFS 29/80  COGNITION: Overall cognitive status: Within functional limits for tasks assessed     SENSATION: WFL   MUSCLE LENGTH: Hamstrings: bilaterally tight  POSTURE: rounded shoulders, forward head, decreased lumbar lordosis, and flexed trunk   PALPATION: TTP right hip/trochanter and throughout IT band  LOWER EXTREMITY ROM:  Active ROM Right eval Left eval  Hip flexion    Hip extension    Hip abduction    Hip adduction    Hip internal rotation 30d   Hip external rotation 30d   Knee flexion    Knee extension    Ankle dorsiflexion    Ankle plantarflexion    Ankle inversion    Ankle eversion     (Blank rows = not tested)  LOWER EXTREMITY MMT:  MMT Right eval Left eval  Hip flexion 25.7 28.6  Hip extension    Hip abduction 35.1 36.6  Hip adduction  Hip internal rotation    Hip external rotation    Knee flexion    Knee extension 47.2 49.3  Ankle dorsiflexion    Ankle plantarflexion    Ankle inversion    Ankle eversion     (Blank rows = not tested)  LOWER EXTREMITY SPECIAL TESTS:  Hip special tests: Luisa Hart (FABER) test: negative  FUNCTIONAL TESTS:  Timed up and go (TUG): 14.91 5x STS 20.36  from bench at pool ue use 4/5 reps  2 min Walk test 333ft  GAIT: Distance walked: 325 ft Assistive device utilized: None Level of assistance: Complete Independence Comments: forward hip flexed position, shorted step length, just clearing heels from floor                                                                                                                                 TREATMENT  Eval Self care: pt instruction on use of a walking stick vs cane. Gait: vc for erect posture/ engaging core and increasing step length with exaggerated heel strike (for increased hip flex).   PATIENT EDUCATION:  Education details: Discussed eval findings, rehab rationale, aquatic program progression/POC and pools in area. Patient is in agreement  Person educated: Patient Education method: Explanation Education comprehension: verbalized understanding  HOME EXERCISE PROGRAM: TBA  ASSESSMENT:  CLINICAL IMPRESSION: Patient is a 73 y.o. m who was seen today for physical therapy evaluation and treatment for Trochanteric bursitis of right hip . He has PmHx of bilateral knee pain which he recently received injections which has also improved his hip pain.  Testing demonstrates weak hip flexors and tight post core. He just clears feet from floor with amb and with shortened step length and slowed cadence.  He is a member here at [employer] but has not been exercising due to pain.  He is planning trip to [place] in may and wants to be able to ambulate without limitation if able.  We did discus a walking stick to be addressed in future sessions.  He will benefit from skilled physical therapy both aquatic and land based to best progress strength and amb toleration preparing for his trip and improve his overall functional mobility/safety with ADL's.  OBJECTIVE IMPAIRMENTS: Abnormal gait, decreased activity tolerance, decreased balance, decreased endurance, decreased mobility, difficulty walking, decreased strength, postural dysfunction, obesity, and pain.   ACTIVITY LIMITATIONS: carrying, lifting, bending, sitting, standing, squatting, stairs, transfers, and locomotion level  PARTICIPATION LIMITATIONS: shopping, community activity, occupation, and yard work  PERSONAL FACTORS: Fitness  and 1-2 comorbidities: see problem list  are also affecting patient's functional outcome.   REHAB POTENTIAL: Good  CLINICAL DECISION MAKING: Evolving/moderate complexity  EVALUATION COMPLEXITY: Moderate   GOALS: Goals reviewed with patient? Yes  SHORT TERM GOALS: Target date: 02/17/24 Pt will be indep with initial land based HEP (using [facility] equipment as approp) for continued management of condition Baseline:none Goal status: INITIAL  2.  Pt will tolerate full aquatic sessions  consistently without increase in pain and with improving function to demonstrate good toleration and effectiveness of intervention.  Baseline:  Goal status: INITIAL  3.  Pt will perform tandem and SLS Baseline:  Goal status: INITIAL  4.  Pt will improve on Tug test to <or= 13 s to demonstrate improvement in lower extremity function, mobility and decreased fall risk. Baseline: 14.91 Goal status: INITIAL   LONG TERM GOALS: Target date: 03/23/24  Pt to improve on LEFS by at least 9 point to demonstrate statistically significant Improvement in function. Baseline: 29/80 Goal status: INITIAL  2.  Pt will demonstrate community ambulation distances without limitation of pain or fatigue using AD as needed Baseline:  Goal status: INITIAL  3.  Pt will be indep with final HEP's (land and aquatic as appropriate) for continued management of condition Baseline:  Goal status: INITIAL  4.  Pt will improve on the by at least 40 ft (MDC) or greater (543ft avg for age) to demonstrate improved functional capacity,endurance and gait speed Baseline: 325 ft Goal status: INITIAL  5.   Pt will reports 75% decrease in right hip pain to demonstrate improved function and effectiveness of intervention. Baseline:  Goal status: INITIAL   PLAN:  PT FREQUENCY: 2x/week  PT DURATION: 9 weeks  PLANNED INTERVENTIONS: 97164- PT Re-evaluation, 97110-Therapeutic exercises, 97530- Therapeutic activity, [id number]  Neuromuscular re-education, 97535- Self Care, 70623- Manual therapy, [id number]- Gait training, [phone]- Orthotic Fit/training, [phone]- Ionotophoresis 4mg /ml Dexamethasone, Patient/Family education, Balance training, Stair training, Taping, Dry Needling, DME instructions, Cryotherapy, and Moist heat  PLAN FOR NEXT SESSION: alternate land/aquatics Initial land: assign HEP.  Pt interested in what he could be using in the gym at [facility] that will benefit and not hurt him.  Both: Hip and posterior core strengthening and stretching, gait and posture correction, body mechanics, aerobic capacity training.   Corrie Dandy Hessmer) Madeleine Fenn MPT 01/18/24 10:34 AM [facility] [address] [place], [place], [address] Phone: [phone]   Fax:  [phone]

## 2024-01-18 ENCOUNTER — Other Ambulatory Visit: Payer: Self-pay

## 2024-01-18 ENCOUNTER — Ambulatory Visit (HOSPITAL_BASED_OUTPATIENT_CLINIC_OR_DEPARTMENT_OTHER): Payer: BC Managed Care – PPO | Attending: Sports Medicine | Admitting: Physical Therapy

## 2024-01-18 ENCOUNTER — Encounter (HOSPITAL_BASED_OUTPATIENT_CLINIC_OR_DEPARTMENT_OTHER): Payer: Self-pay | Admitting: Physical Therapy

## 2024-01-18 DIAGNOSIS — M7061 Trochanteric bursitis, right hip: Secondary | ICD-10-CM | POA: Diagnosis not present

## 2024-01-18 DIAGNOSIS — M6281 Muscle weakness (generalized): Secondary | ICD-10-CM | POA: Insufficient documentation

## 2024-01-18 DIAGNOSIS — R2689 Other abnormalities of gait and mobility: Secondary | ICD-10-CM | POA: Diagnosis not present

## 2024-01-18 DIAGNOSIS — M25551 Pain in right hip: Secondary | ICD-10-CM | POA: Diagnosis not present

## 2024-01-19 NOTE — Therapy (Signed)
 OUTPATIENT PHYSICAL THERAPY TREATMENT   Patient Name: Marc Serrano MRN: [id number] DOB:08-Dec-1950, 73 y.o., male Today's Date: 01/20/2024  END OF SESSION:  PT End of Session - 01/20/24 1145     Visit Number 2    Number of Visits 16    Date for PT Re-Evaluation 03/23/24    Authorization Type BCBS    PT Start Time 1145    PT Stop Time 1230    PT Time Calculation (min) 45 min    Activity Tolerance Patient tolerated treatment well              Past Medical History:  Diagnosis Date   Asthma    Cancer (HCC)    Diabetes mellitus without complication (HCC)    Hypertension    Past Surgical History:  Procedure Laterality Date   APPENDECTOMY     HEMORRHOID SURGERY     sphincterectomy rectal     TONSILLECTOMY     Patient Active Problem List   Diagnosis Date Noted   Acute bronchitis 01/11/2015   SIRS (systemic inflammatory response syndrome) (HCC) 01/10/2015   Diabetes mellitus type 2, uncontrolled 01/10/2015   Hypertension 01/10/2015    PCP: Merri Brunette, MD  REFERRING PROVIDER: Hurman Horn, MD   REFERRING DIAG: M70.61 (ICD-10-CM) - Trochanteric bursitis of right hip   THERAPY DIAG:  Right hip pain  Muscle weakness (generalized)  Other abnormalities of gait and mobility  Rationale for Evaluation and Treatment: Rehabilitation  ONSET DATE: 2-3 years  SUBJECTIVE:   Per eval - Injections in knees a few weeks ago. Hips and knees were hurting bad. Injections cleared up my hips better.  4-5 years ago I was walking 5 miles a day. Walking daily 01-4999 steps a day trying to get back up to 10000.  SUBJECTIVE STATEMENT: 01/20/2024 Pt states he had a bit of a catch this morning but no pain at present. No other new updates  PERTINENT HISTORY: Bilateral knee pain PAIN:  Are you having pain? Yes: NPRS scale: R hip current 0/10    Per eval -  worst 4/10; Pain location: right hip and lateral aspect of thigh/IT band Pain description: dull ache Aggravating  factors: walking > 12 minutes Relieving factors: rest, sitting, OTC med  PRECAUTIONS: None  RED FLAGS: None   WEIGHT BEARING RESTRICTIONS: No  FALLS:  Has patient fallen in last 6 months? 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  DIAGNOSTIC FINDINGS: nothing recent  PATIENT SURVEYS:  LEFS 29/80  COGNITION: Overall cognitive status: Within functional limits for tasks assessed     SENSATION: WFL   MUSCLE LENGTH: Hamstrings: bilaterally tight  POSTURE: rounded shoulders, forward head, decreased lumbar lordosis, and flexed trunk   PALPATION: TTP right hip/trochanter and throughout IT band  LOWER EXTREMITY ROM:  Active ROM Right eval Left eval  Hip flexion    Hip extension    Hip abduction    Hip adduction    Hip internal rotation 30d   Hip external rotation 30d   Knee flexion    Knee extension    Ankle dorsiflexion    Ankle plantarflexion    Ankle inversion    Ankle eversion     (Blank rows = not  tested)  LOWER EXTREMITY MMT:  MMT Right eval Left eval  Hip flexion 25.7 28.6  Hip extension    Hip abduction 35.1 36.6  Hip adduction    Hip internal rotation    Hip external rotation    Knee flexion    Knee extension 47.2 49.3  Ankle dorsiflexion    Ankle plantarflexion    Ankle inversion    Ankle eversion     (Blank rows = not tested)  LOWER EXTREMITY SPECIAL TESTS:  Hip special tests: Luisa Hart (FABER) test: negative  FUNCTIONAL TESTS:  Timed up and go (TUG): 14.91 5x STS 20.36  from bench at pool ue use 4/5 reps  2 min Walk test 379ft  GAIT: Distance walked: 325 ft Assistive device utilized: None Level of assistance:  Complete Independence Comments: forward hip flexed position, shorted step length, just clearing heels from floor                                                                                                                                TREATMENT  [facility] Adult PT Treatment:                                                DATE: 01/20/24 Therapeutic Exercise: Red band hip abduction 2x12 seated  Red band hip ER w/ ball as fulcrum x12 Red band hip IR w/ ball as fulcrum 2x10 Seated hip flexion red band 2x10 BIL HEP education + handout, rationale for interventions  Therapeutic Activity: STS from raised mat 4x5 cues for symmetrical WB Slow marches 2x5 BIL w/ UE support  cues for posture and pacing  Education/discussion re: pacing of activities, walking program     Eval Self care: pt instruction on use of a walking stick vs cane. Gait: vc for erect posture/ engaging core and increasing step length with exaggerated heel strike (for increased hip flex).   PATIENT EDUCATION:  Education details: rationale for interventions, HEP  Person educated: Patient Education method: Explanation, Demonstration, Tactile cues, Verbal cues Education comprehension: verbalized understanding, returned demonstration, verbal cues required, tactile cues required, and needs further education     HOME EXERCISE PROGRAM: Access Code: [email or web address] URL: [email or web address] Date: 01/20/2024 Prepared by: Fransisco Hertz  Exercises - Seated Hip Abduction with Resistance  - 2-3 x daily - 1 sets - 8-10 reps - Seated Hip Internal Rotation with Ball and Resistance  - 2-3 x daily - 1 sets - 8-10 reps - Standing March with Counter Support  - 2-3 x daily - 1 sets - 5-8 reps  ASSESSMENT:  CLINICAL IMPRESSION: 01/20/2024 Pt arrives w/o overt pain, no issues after initial eval. Today focusing on building introductory hip strengthening program which pt tolerates well. Also working on Academic librarian w/ equal  weight shift (demos tendency to offload RLE) and slow  marches to promote improved functional endurance and WB tolerance particularly on RLE. Tolerates activities well without increase in resting pain. Also discussed pacing of activities, walking program. No adverse events. Recommend continuing along current POC in order to address relevant deficits and improve functional tolerance. Pt departs today's session in no acute distress, all voiced questions/concerns addressed appropriately from PT perspective.    Per eval - Patient is a 73 y.o. m who was seen today for physical therapy evaluation and treatment for Trochanteric bursitis of right hip . He has PmHx of bilateral knee pain which he recently received injections which has also improved his hip pain.  Testing demonstrates weak hip flexors and tight post core. He just clears feet from floor with amb and with shortened step length and slowed cadence.  He is a member here at [employer] but has not been exercising due to pain.  He is planning trip to [place] in may and wants to be able to ambulate without limitation if able.  We did discus a walking stick to be addressed in future sessions.  He will benefit from skilled physical therapy both aquatic and land based to best progress strength and amb toleration preparing for his trip and improve his overall functional mobility/safety with ADL's.  OBJECTIVE IMPAIRMENTS: Abnormal gait, decreased activity tolerance, decreased balance, decreased endurance, decreased mobility, difficulty walking, decreased strength, postural dysfunction, obesity, and pain.   ACTIVITY LIMITATIONS: carrying, lifting, bending, sitting, standing, squatting, stairs, transfers, and locomotion level  PARTICIPATION LIMITATIONS: shopping, community activity, occupation, and yard work  PERSONAL FACTORS: Fitness and 1-2 comorbidities: see problem list  are also affecting patient's functional outcome.   REHAB POTENTIAL: Good  CLINICAL  DECISION MAKING: Evolving/moderate complexity  EVALUATION COMPLEXITY: Moderate   GOALS: Goals reviewed with patient? Yes  SHORT TERM GOALS: Target date: 02/17/24 Pt will be indep with initial land based HEP (using [employer] equipment as approp) for continued management of condition Baseline:none Goal status: INITIAL  2.  Pt will tolerate full aquatic sessions consistently without increase in pain and with improving function to demonstrate good toleration and effectiveness of intervention.  Baseline:  Goal status: INITIAL  3.  Pt will perform tandem and SLS Baseline:  Goal status: INITIAL  4.  Pt will improve on Tug test to <or= 13 s to demonstrate improvement in lower extremity function, mobility and decreased fall risk. Baseline: 14.91 Goal status: INITIAL   LONG TERM GOALS: Target date: 03/23/24  Pt to improve on LEFS by at least 9 point to demonstrate statistically significant Improvement in function. Baseline: 29/80 Goal status: INITIAL  2.  Pt will demonstrate community ambulation distances without limitation of pain or fatigue using AD as needed Baseline:  Goal status: INITIAL  3.  Pt will be indep with final HEP's (land and aquatic as appropriate) for continued management of condition Baseline:  Goal status: INITIAL  4.  Pt will improve on the by at least 40 ft (MDC) or greater (57ft avg for age) to demonstrate improved functional capacity,endurance and gait speed Baseline: 325 ft Goal status: INITIAL  5.   Pt will reports 75% decrease in right hip pain to demonstrate improved function and effectiveness of intervention. Baseline:  Goal status: INITIAL   PLAN:  PT FREQUENCY: 2x/week  PT DURATION: 9 weeks  PLANNED INTERVENTIONS: 97164- PT Re-evaluation, 97110-Therapeutic exercises, 97530- Therapeutic activity, 97112- Neuromuscular re-education, 97535- Self Care, 29562- Manual therapy, [phone]- Gait training, [phone]- Orthotic Fit/training, [phone]-  Ionotophoresis 4mg /ml Dexamethasone, Patient/Family education, Balance training, Stair  training, Taping, Dry Needling, DME instructions, Cryotherapy, and Moist heat  PLAN FOR NEXT SESSION: alternate land/aquatics Land: would recommend doing a land visit coming up to work on gym machines as appropriate  Both: Hip and posterior core strengthening and stretching, gait and posture correction, body mechanics, aerobic capacity training.   Ashley Murrain PT, DPT 01/20/2024 12:42 PM    [facility] [address] [place], [place], [address] Phone: [phone]   Fax:  [phone]

## 2024-01-20 ENCOUNTER — Encounter (HOSPITAL_BASED_OUTPATIENT_CLINIC_OR_DEPARTMENT_OTHER): Payer: Self-pay | Admitting: Physical Therapy

## 2024-01-20 ENCOUNTER — Ambulatory Visit (HOSPITAL_BASED_OUTPATIENT_CLINIC_OR_DEPARTMENT_OTHER): Admitting: Physical Therapy

## 2024-01-20 DIAGNOSIS — R2689 Other abnormalities of gait and mobility: Secondary | ICD-10-CM

## 2024-01-20 DIAGNOSIS — M25551 Pain in right hip: Secondary | ICD-10-CM

## 2024-01-20 DIAGNOSIS — M6281 Muscle weakness (generalized): Secondary | ICD-10-CM

## 2024-01-20 DIAGNOSIS — M7061 Trochanteric bursitis, right hip: Secondary | ICD-10-CM | POA: Diagnosis not present

## 2024-01-24 ENCOUNTER — Ambulatory Visit (HOSPITAL_BASED_OUTPATIENT_CLINIC_OR_DEPARTMENT_OTHER): Admitting: Physical Therapy

## 2024-01-24 ENCOUNTER — Encounter (HOSPITAL_BASED_OUTPATIENT_CLINIC_OR_DEPARTMENT_OTHER): Payer: Self-pay | Admitting: Physical Therapy

## 2024-01-24 DIAGNOSIS — M6281 Muscle weakness (generalized): Secondary | ICD-10-CM

## 2024-01-24 DIAGNOSIS — M25551 Pain in right hip: Secondary | ICD-10-CM | POA: Diagnosis not present

## 2024-01-24 DIAGNOSIS — R2689 Other abnormalities of gait and mobility: Secondary | ICD-10-CM

## 2024-01-24 DIAGNOSIS — M7061 Trochanteric bursitis, right hip: Secondary | ICD-10-CM | POA: Diagnosis not present

## 2024-01-24 NOTE — Therapy (Signed)
 OUTPATIENT PHYSICAL THERAPY TREATMENT   Patient Name: Marc Serrano MRN: [id number] DOB:[birth year], 73 y.o., male Today's Date: 01/24/2024  END OF SESSION:  PT End of Session - 01/24/24 0922     Visit Number 3    Number of Visits 16    Date for PT Re-Evaluation 03/23/24    Authorization Type BCBS    PT Start Time [phone]    PT Stop Time 1005    PT Time Calculation (min) 41 min    Activity Tolerance Patient tolerated treatment well              Past Medical History:  Diagnosis Date   Asthma    Cancer (HCC)    Diabetes mellitus without complication (HCC)    Hypertension    Past Surgical History:  Procedure Laterality Date   APPENDECTOMY     HEMORRHOID SURGERY     sphincterectomy rectal     TONSILLECTOMY     Patient Active Problem List   Diagnosis Date Noted   Acute bronchitis 01/11/2015   SIRS (systemic inflammatory response syndrome) (HCC) 01/10/2015   Diabetes mellitus type 2, uncontrolled 01/10/2015   Hypertension 01/10/2015    PCP: Merri Brunette, MD  REFERRING PROVIDER: Hurman Horn, MD   REFERRING DIAG: M70.61 (ICD-10-CM) - Trochanteric bursitis of right hip   THERAPY DIAG:  Right hip pain  Muscle weakness (generalized)  Other abnormalities of gait and mobility  Rationale for Evaluation and Treatment: Rehabilitation  ONSET DATE: 2-3 years  SUBJECTIVE:   Per eval - Injections in knees a few weeks ago. Hips and knees were hurting bad. Injections cleared up my hips better.  4-5 years ago I was walking 5 miles a day. Walking daily 01-4999 steps a day trying to get back up to 10000.  SUBJECTIVE STATEMENT: Land went well.  Have some exercises I can do on my own.  PERTINENT HISTORY: Bilateral knee pain PAIN:  Are you having pain? Yes: NPRS scale: R hip current 0/10    Per eval -  worst 4/10; Pain location: right hip and lateral aspect of thigh/IT band Pain description: dull ache Aggravating factors: walking > 12 minutes Relieving factors:  rest, sitting, OTC med  PRECAUTIONS: None  RED FLAGS: None   WEIGHT BEARING RESTRICTIONS: No  FALLS:  Has patient fallen in last 6 months? 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  DIAGNOSTIC FINDINGS: nothing recent  PATIENT SURVEYS:  LEFS 29/80  COGNITION: Overall cognitive status: Within functional limits for tasks assessed     SENSATION: WFL   MUSCLE LENGTH: Hamstrings: bilaterally tight  POSTURE: rounded shoulders, forward head, decreased lumbar lordosis, and flexed trunk   PALPATION: TTP right hip/trochanter and throughout IT band  LOWER EXTREMITY ROM:  Active ROM Right eval Left eval  Hip flexion    Hip extension    Hip abduction    Hip adduction    Hip internal rotation 30d   Hip external rotation 30d   Knee flexion    Knee extension    Ankle dorsiflexion    Ankle plantarflexion    Ankle inversion    Ankle eversion     (Blank rows = not tested)  LOWER EXTREMITY MMT:  MMT Right  eval Left eval  Hip flexion 25.7 28.6  Hip extension    Hip abduction 35.1 36.6  Hip adduction    Hip internal rotation    Hip external rotation    Knee flexion    Knee extension 47.2 49.3  Ankle dorsiflexion    Ankle plantarflexion    Ankle inversion    Ankle eversion     (Blank rows = not tested)  LOWER EXTREMITY SPECIAL TESTS:  Hip special tests: Luisa Hart (FABER) test: negative  FUNCTIONAL TESTS:  Timed up and go (TUG): 14.91 5x STS 20.36  from bench at pool ue use 4/5 reps  2 min Walk test 338ft  GAIT: Distance walked: 325 ft Assistive device utilized: None Level of assistance: Complete Independence Comments: forward hip flexed  position, shorted step length, just clearing heels from floor                                                                                                                                TREATMENT     [facility] Adult PT Treatment:                                                DATE: 01/24/24 Pt seen for aquatic therapy today.  Treatment took place in water 3.5-4.75 ft in depth at the [facility] pool. Temp of water was 91.  Pt entered/exited the pool via stairs using step to pattern with hand rail.  *Intro to setting *walking forward, back  in 3.6->4.0 ft-> 4.4 with ue support of barbell->unsupported.  VC for exaggerated knee flex and heel strike forward as well as backward with toe strike. *side stepping ue add/abd x 4 widths *Ue support on wall 4.34ft: toe raises; heel raises; hip add/abd; hip extension; 3.6 ft relaxed squats; hip circles cw&ccw x10 *seated on bench:cycling; hip add/abd; LAQ x10-12 *STS from bench onto water step x 10 unsupported *Step ups onto water step leading R/L x 10. Some ue support needed when leading right. Cues for controlled  *3 way hamstring stretch using blue noodle. Min asst for noodle placement.   Pt requires the buoyancy and hydrostatic pressure of water for support, and to offload joints by unweighting joint load by at least 50 % in navel deep water and by at least 75-80% in chest to neck deep water.  Viscosity of the water is needed for resistance of strengthening. Water current perturbations provides challenge to standing balance requiring increased core activation.   [facility] Adult PT Treatment:                                                DATE: 01/20/24 Therapeutic Exercise: Red band  hip abduction 2x12 seated  Red band hip ER w/ ball as fulcrum x12 Red band hip IR w/ ball as fulcrum 2x10 Seated hip flexion red band 2x10 BIL HEP education + handout, rationale for interventions  Therapeutic Activity: STS from raised mat 4x5 cues for symmetrical  WB Slow marches 2x5 BIL w/ UE support  cues for posture and pacing  Education/discussion re: pacing of activities, walking program      PATIENT EDUCATION:  Education details: rationale for interventions, HEP  Person educated: Patient Education method: Explanation, Demonstration, Tactile cues, Verbal cues Education comprehension: verbalized understanding, returned demonstration, verbal cues required, tactile cues required, and needs further education     HOME EXERCISE PROGRAM: Access Code: [email or web address] URL: [email or web address] Date: 01/20/2024 Prepared by: Fransisco Hertz  Exercises - Seated Hip Abduction with Resistance  - 2-3 x daily - 1 sets - 8-10 reps - Seated Hip Internal Rotation with Ball and Resistance  - 2-3 x daily - 1 sets - 8-10 reps - Standing March with Counter Support  - 2-3 x daily - 1 sets - 5-8 reps  ASSESSMENT:  CLINICAL IMPRESSION: Pt demonstrates safety and independence in aquatic setting with therapist instructing from deck. He has minor apprehension with submersion which decreases quickly. Pt able to move from ue support to unsupported with confidence  moving throughout all depths easily.  Pt is directed through various movement patterns and trials in both sitting and standing positions. Initiated balance challenging with STS as well as stretching posterior LE (hamstrings and gastroc). He tolerates very well.  No reports of pain.  Recommend continuing along current POC in order to address relevant deficits and improve functional tolerance.      Per eval - Patient is a 73 y.o. m who was seen today for physical therapy evaluation and treatment for Trochanteric bursitis of right hip . He has PmHx of bilateral knee pain which he recently received injections which has also improved his hip pain.  Testing demonstrates weak hip flexors and tight post core. He just clears feet from floor with amb and with shortened step length and slowed cadence.  He is a  member here at [employer] but has not been exercising due to pain.  He is planning trip to [place] in may and wants to be able to ambulate without limitation if able.  We did discus a walking stick to be addressed in future sessions.  He will benefit from skilled physical therapy both aquatic and land based to best progress strength and amb toleration preparing for his trip and improve his overall functional mobility/safety with ADL's.  OBJECTIVE IMPAIRMENTS: Abnormal gait, decreased activity tolerance, decreased balance, decreased endurance, decreased mobility, difficulty walking, decreased strength, postural dysfunction, obesity, and pain.   ACTIVITY LIMITATIONS: carrying, lifting, bending, sitting, standing, squatting, stairs, transfers, and locomotion level  PARTICIPATION LIMITATIONS: shopping, community activity, occupation, and yard work  PERSONAL FACTORS: Fitness and 1-2 comorbidities: see problem list  are also affecting patient's functional outcome.   REHAB POTENTIAL: Good  CLINICAL DECISION MAKING: Evolving/moderate complexity  EVALUATION COMPLEXITY: Moderate   GOALS: Goals reviewed with patient? Yes  SHORT TERM GOALS: Target date: 02/17/24 Pt will be indep with initial land based HEP (using [employer] equipment as approp) for continued management of condition Baseline:none Goal status: INITIAL  2.  Pt will tolerate full aquatic sessions consistently without increase in pain and with improving function to demonstrate good toleration and effectiveness of intervention.  Baseline:  Goal status: INITIAL  3.  Pt will perform  tandem and SLS Baseline:  Goal status: INITIAL  4.  Pt will improve on Tug test to <or= 13 s to demonstrate improvement in lower extremity function, mobility and decreased fall risk. Baseline: 14.91 Goal status: INITIAL   LONG TERM GOALS: Target date: 03/23/24  Pt to improve on LEFS by at least 9 point to demonstrate statistically significant Improvement in  function. Baseline: 29/80 Goal status: INITIAL  2.  Pt will demonstrate community ambulation distances without limitation of pain or fatigue using AD as needed Baseline:  Goal status: INITIAL  3.  Pt will be indep with final HEP's (land and aquatic as appropriate) for continued management of condition Baseline:  Goal status: INITIAL  4.  Pt will improve on the by at least 40 ft (MDC) or greater (528ft avg for age) to demonstrate improved functional capacity,endurance and gait speed Baseline: 325 ft Goal status: INITIAL  5.   Pt will reports 75% decrease in right hip pain to demonstrate improved function and effectiveness of intervention. Baseline:  Goal status: INITIAL   PLAN:  PT FREQUENCY: 2x/week  PT DURATION: 9 weeks  PLANNED INTERVENTIONS: 97164- PT Re-evaluation, 97110-Therapeutic exercises, 97530- Therapeutic activity, 97112- Neuromuscular re-education, 97535- Self Care, 16109- Manual therapy, [phone]- Gait training, [phone]- Orthotic Fit/training, [phone]- Ionotophoresis 4mg /ml Dexamethasone, Patient/Family education, Balance training, Stair training, Taping, Dry Needling, DME instructions, Cryotherapy, and Moist heat  PLAN FOR NEXT SESSION: alternate land/aquatics Land: would recommend doing a land visit coming up to work on gym machines as appropriate  Both: Hip and posterior core strengthening and stretching, gait and posture correction, body mechanics, aerobic capacity training.   [address] [place]) Queena Monrreal MPT 01/24/24 9:28 AM [facility] [address] [place], [place], [address] Phone: [phone]   Fax:  [phone]

## 2024-02-01 ENCOUNTER — Ambulatory Visit (HOSPITAL_BASED_OUTPATIENT_CLINIC_OR_DEPARTMENT_OTHER): Attending: Sports Medicine | Admitting: Physical Therapy

## 2024-02-01 ENCOUNTER — Encounter (HOSPITAL_BASED_OUTPATIENT_CLINIC_OR_DEPARTMENT_OTHER): Payer: Self-pay | Admitting: Physical Therapy

## 2024-02-01 DIAGNOSIS — R2689 Other abnormalities of gait and mobility: Secondary | ICD-10-CM | POA: Insufficient documentation

## 2024-02-01 DIAGNOSIS — M25551 Pain in right hip: Secondary | ICD-10-CM | POA: Diagnosis not present

## 2024-02-01 DIAGNOSIS — M6281 Muscle weakness (generalized): Secondary | ICD-10-CM | POA: Diagnosis not present

## 2024-02-01 NOTE — Therapy (Signed)
 OUTPATIENT PHYSICAL THERAPY TREATMENT   Patient Name: Marc Serrano MRN: [id number] DOB:[birth year], 73 y.o., male Today's Date: 02/01/2024  END OF SESSION:  PT End of Session - 02/01/24 0809     Visit Number 4    Number of Visits 16    Date for PT Re-Evaluation 03/23/24    Authorization Type BCBS    PT Start Time 0802    PT Stop Time 0840    PT Time Calculation (min) 38 min    Activity Tolerance Patient tolerated treatment well    Behavior During Therapy WFL for tasks assessed/performed              Past Medical History:  Diagnosis Date   Asthma    Cancer (HCC)    Diabetes mellitus without complication (HCC)    Hypertension    Past Surgical History:  Procedure Laterality Date   APPENDECTOMY     HEMORRHOID SURGERY     sphincterectomy rectal     TONSILLECTOMY     Patient Active Problem List   Diagnosis Date Noted   Acute bronchitis 01/11/2015   SIRS (systemic inflammatory response syndrome) (HCC) 01/10/2015   Diabetes mellitus type 2, uncontrolled 01/10/2015   Hypertension 01/10/2015    PCP: Merri Brunette, MD  REFERRING PROVIDER: Hurman Horn, MD   REFERRING DIAG: M70.61 (ICD-10-CM) - Trochanteric bursitis of right hip   THERAPY DIAG:  Right hip pain  Muscle weakness (generalized)  Other abnormalities of gait and mobility  Rationale for Evaluation and Treatment: Rehabilitation  ONSET DATE: 2-3 years  SUBJECTIVE:   Per eval - Injections in knees a few weeks ago. Hips and knees were hurting bad. Injections cleared up my hips better.  4-5 years ago I was walking 5 miles a day. Walking daily 01-4999 steps a day trying to get back up to 10000.  SUBJECTIVE STATEMENT: Feeling pretty good.  Trying to take longer steps when I walk  PERTINENT HISTORY: Bilateral knee pain PAIN:  Are you having pain? Yes: NPRS scale: R hip current 0/10    Per eval -  worst 4/10; Pain location: right hip and lateral aspect of thigh/IT band Pain description: dull  ache Aggravating factors: walking > 12 minutes Relieving factors: rest, sitting, OTC med  PRECAUTIONS: None  RED FLAGS: None   WEIGHT BEARING RESTRICTIONS: No  FALLS:  Has patient fallen in last 6 months? 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  DIAGNOSTIC FINDINGS: nothing recent  PATIENT SURVEYS:  LEFS 29/80  COGNITION: Overall cognitive status: Within functional limits for tasks assessed     SENSATION: WFL   MUSCLE LENGTH: Hamstrings: bilaterally tight  POSTURE: rounded shoulders, forward head, decreased lumbar lordosis, and flexed trunk   PALPATION: TTP right hip/trochanter and throughout IT band  LOWER EXTREMITY ROM:  Active ROM Right eval Left eval  Hip flexion    Hip extension    Hip abduction    Hip adduction    Hip internal rotation 30d   Hip external rotation 30d   Knee flexion    Knee extension    Ankle dorsiflexion    Ankle plantarflexion    Ankle inversion    Ankle eversion     (Blank rows =  not tested)  LOWER EXTREMITY MMT:  MMT Right eval Left eval  Hip flexion 25.7 28.6  Hip extension    Hip abduction 35.1 36.6  Hip adduction    Hip internal rotation    Hip external rotation    Knee flexion    Knee extension 47.2 49.3  Ankle dorsiflexion    Ankle plantarflexion    Ankle inversion    Ankle eversion     (Blank rows = not tested)  LOWER EXTREMITY SPECIAL TESTS:  Hip special tests: Luisa Hart (FABER) test: negative  FUNCTIONAL TESTS:  Timed up and go (TUG): 14.91 5x STS 20.36  from bench at pool ue use 4/5 reps  2 min Walk test 334ft  GAIT: Distance walked: 325 ft Assistive device utilized: None Level of  assistance: Complete Independence Comments: forward hip flexed position, shorted step length, just clearing heels from floor                                                                                                                                TREATMENT     [facility] Adult PT Treatment:                                                DATE: 02/01/24 Pt seen for aquatic therapy today.  Treatment took place in water 3.5-4.75 ft in depth at the [facility] pool. Temp of water was 91.  Pt entered/exited the pool via stairs using step to pattern with hand rail.   *walking forward, back  in 3.6-unsupported.  VC for exaggerated knee flex and heel strike forward as well as backward with toe strike. *side stepping ue add/abd x 4 widths->side lunge x 3 widths ue support yellow HB (good challenge coordination and balance) *step ups bottom step leading R/L 2 x 5 *step tapping bottom step then 2nd step alternating pattern x 10 ea. Cues for pacing  *STS progressed from water bench to 3rd step x 10 *solid noodle press wide and staggered stances 4.1ft x 8-10-> yellow noodle 4.0 ft x5 same positions-oblique set x 5 R/L *Ue support on wall 4.85ft progressed ue support yellow: toe raises; heel raises;   - Tandem stance ue support noodle x 10s leading R/L then unsupported *tandem walking forward then back 2 widths ea [place] support yellow noodle  Pt requires the buoyancy and hydrostatic pressure of water for support, and to offload joints by unweighting joint load by at least 50 % in navel deep water and by at least 75-80% in chest to neck deep water.  Viscosity of the water is needed for resistance of strengthening. Water current perturbations provides challenge to standing balance requiring increased core activation.   [facility] Adult PT Treatment:  DATE: 01/20/24 Therapeutic Exercise: Red band hip abduction 2x12 seated  Red band hip ER w/ ball as fulcrum  x12 Red band hip IR w/ ball as fulcrum 2x10 Seated hip flexion red band 2x10 BIL HEP education + handout, rationale for interventions  Therapeutic Activity: STS from raised mat 4x5 cues for symmetrical WB Slow marches 2x5 BIL w/ UE support  cues for posture and pacing  Education/discussion re: pacing of activities, walking program      PATIENT EDUCATION:  Education details: rationale for interventions, HEP  Person educated: Patient Education method: Explanation, Demonstration, Tactile cues, Verbal cues Education comprehension: verbalized understanding, returned demonstration, verbal cues required, tactile cues required, and needs further education     HOME EXERCISE PROGRAM: Access Code: [email or web address] URL: [email or web address] Date: 01/20/2024 Prepared by: Fransisco Hertz  Exercises - Seated Hip Abduction with Resistance  - 2-3 x daily - 1 sets - 8-10 reps - Seated Hip Internal Rotation with Ball and Resistance  - 2-3 x daily - 1 sets - 8-10 reps - Standing March with Counter Support  - 2-3 x daily - 1 sets - 5-8 reps  ASSESSMENT:  CLINICAL IMPRESSION: Pt with good response to all areas progressed - strengthening and balance.  He reports mindfulness with amb at home to take larger steps and feeling more steady. Progressed balance challenges with good toleration.  Requires extra time to gain motor plans then executes well. Good core engagement with session today. Goals ongoing     Per eval - Patient is a 73 y.o. m who was seen today for physical therapy evaluation and treatment for Trochanteric bursitis of right hip . He has PmHx of bilateral knee pain which he recently received injections which has also improved his hip pain.  Testing demonstrates weak hip flexors and tight post core. He just clears feet from floor with amb and with shortened step length and slowed cadence.  He is a member here at [employer] but has not been exercising due to pain.  He is planning trip to  [place] in may and wants to be able to ambulate without limitation if able.  We did discus a walking stick to be addressed in future sessions.  He will benefit from skilled physical therapy both aquatic and land based to best progress strength and amb toleration preparing for his trip and improve his overall functional mobility/safety with ADL's.  OBJECTIVE IMPAIRMENTS: Abnormal gait, decreased activity tolerance, decreased balance, decreased endurance, decreased mobility, difficulty walking, decreased strength, postural dysfunction, obesity, and pain.   ACTIVITY LIMITATIONS: carrying, lifting, bending, sitting, standing, squatting, stairs, transfers, and locomotion level  PARTICIPATION LIMITATIONS: shopping, community activity, occupation, and yard work  PERSONAL FACTORS: Fitness and 1-2 comorbidities: see problem list  are also affecting patient's functional outcome.   REHAB POTENTIAL: Good  CLINICAL DECISION MAKING: Evolving/moderate complexity  EVALUATION COMPLEXITY: Moderate   GOALS: Goals reviewed with patient? Yes  SHORT TERM GOALS: Target date: 02/17/24 Pt will be indep with initial land based HEP (using [employer] equipment as approp) for continued management of condition Baseline:none Goal status: INITIAL  2.  Pt will tolerate full aquatic sessions consistently without increase in pain and with improving function to demonstrate good toleration and effectiveness of intervention.  Baseline:  Goal status: INITIAL  3.  Pt will perform tandem and SLS Baseline:  Goal status: INITIAL  4.  Pt will improve on Tug test to <or= 13 s to demonstrate improvement in lower extremity function, mobility and decreased fall risk. Baseline: 14.91 Goal  status: INITIAL   LONG TERM GOALS: Target date: 03/23/24  Pt to improve on LEFS by at least 9 point to demonstrate statistically significant Improvement in function. Baseline: 29/80 Goal status: INITIAL  2.  Pt will demonstrate community  ambulation distances without limitation of pain or fatigue using AD as needed Baseline:  Goal status: INITIAL  3.  Pt will be indep with final HEP's (land and aquatic as appropriate) for continued management of condition Baseline:  Goal status: INITIAL  4.  Pt will improve on the by at least 40 ft (MDC) or greater (569ft avg for age) to demonstrate improved functional capacity,endurance and gait speed Baseline: 325 ft Goal status: INITIAL  5.   Pt will reports 75% decrease in right hip pain to demonstrate improved function and effectiveness of intervention. Baseline:  Goal status: INITIAL   PLAN:  PT FREQUENCY: 2x/week  PT DURATION: 9 weeks  PLANNED INTERVENTIONS: 97164- PT Re-evaluation, 97110-Therapeutic exercises, 97530- Therapeutic activity, 97112- Neuromuscular re-education, 97535- Self Care, 16109- Manual therapy, [phone]- Gait training, [phone]- Orthotic Fit/training, [phone]- Ionotophoresis 4mg /ml Dexamethasone, Patient/Family education, Balance training, Stair training, Taping, Dry Needling, DME instructions, Cryotherapy, and Moist heat  PLAN FOR NEXT SESSION: alternate land/aquatics Land: would recommend doing a land visit coming up to work on gym machines as appropriate  Both: Hip and posterior core strengthening and stretching, gait and posture correction, body mechanics, aerobic capacity training.   Corrie Dandy [place]) Rickie Gange MPT 02/01/24 10:19 AM [facility] [address] [place], [place], [address] Phone: [phone]   Fax:  [phone]

## 2024-02-03 ENCOUNTER — Encounter (HOSPITAL_BASED_OUTPATIENT_CLINIC_OR_DEPARTMENT_OTHER): Payer: Self-pay | Admitting: Physical Therapy

## 2024-02-03 ENCOUNTER — Ambulatory Visit (HOSPITAL_BASED_OUTPATIENT_CLINIC_OR_DEPARTMENT_OTHER): Admitting: Physical Therapy

## 2024-02-03 DIAGNOSIS — M6281 Muscle weakness (generalized): Secondary | ICD-10-CM | POA: Diagnosis not present

## 2024-02-03 DIAGNOSIS — R2689 Other abnormalities of gait and mobility: Secondary | ICD-10-CM

## 2024-02-03 DIAGNOSIS — M25551 Pain in right hip: Secondary | ICD-10-CM

## 2024-02-03 NOTE — Therapy (Signed)
 OUTPATIENT PHYSICAL THERAPY TREATMENT   Patient Name: Marc Serrano MRN: [id number] DOB:07/09/1951, 73 y.o., male Today's Date: 02/03/2024  END OF SESSION:  PT End of Session - 02/03/24 0808     Visit Number 5    Number of Visits 16    Date for PT Re-Evaluation 03/23/24    Authorization Type BCBS    PT Start Time 0801    PT Stop Time 0840    PT Time Calculation (min) 39 min    Activity Tolerance Patient tolerated treatment well    Behavior During Therapy WFL for tasks assessed/performed              Past Medical History:  Diagnosis Date   Asthma    Cancer (HCC)    Diabetes mellitus without complication (HCC)    Hypertension    Past Surgical History:  Procedure Laterality Date   APPENDECTOMY     HEMORRHOID SURGERY     sphincterectomy rectal     TONSILLECTOMY     Patient Active Problem List   Diagnosis Date Noted   Acute bronchitis 01/11/2015   SIRS (systemic inflammatory response syndrome) (HCC) 01/10/2015   Diabetes mellitus type 2, uncontrolled 01/10/2015   Hypertension 01/10/2015    PCP: Merri Brunette, MD  REFERRING PROVIDER: Hurman Horn, MD   REFERRING DIAG: M70.61 (ICD-10-CM) - Trochanteric bursitis of right hip   THERAPY DIAG:  Right hip pain  Muscle weakness (generalized)  Other abnormalities of gait and mobility  Rationale for Evaluation and Treatment: Rehabilitation  ONSET DATE: 2-3 years  SUBJECTIVE:   Per eval - Injections in knees a few weeks ago. Hips and knees were hurting bad. Injections cleared up my hips better.  4-5 years ago I was walking 5 miles a day. Walking daily 01-4999 steps a day trying to get back up to 10000.  SUBJECTIVE STATEMENT: Feeling more steady  PERTINENT HISTORY: Bilateral knee pain PAIN:  Are you having pain? Yes: NPRS scale: R hip current 0/10    Per eval -  worst 4/10; Pain location: right hip and lateral aspect of thigh/IT band Pain description: dull ache Aggravating factors: walking > 12  minutes Relieving factors: rest, sitting, OTC med  PRECAUTIONS: None  RED FLAGS: None   WEIGHT BEARING RESTRICTIONS: No  FALLS:  Has patient fallen in last 6 months? 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  DIAGNOSTIC FINDINGS: nothing recent  PATIENT SURVEYS:  LEFS 29/80  COGNITION: Overall cognitive status: Within functional limits for tasks assessed     SENSATION: WFL   MUSCLE LENGTH: Hamstrings: bilaterally tight  POSTURE: rounded shoulders, forward head, decreased lumbar lordosis, and flexed trunk   PALPATION: TTP right hip/trochanter and throughout IT band  LOWER EXTREMITY ROM:  Active ROM Right eval Left eval  Hip flexion    Hip extension    Hip abduction    Hip adduction    Hip internal rotation 30d   Hip external rotation 30d   Knee flexion    Knee extension    Ankle dorsiflexion    Ankle plantarflexion    Ankle inversion    Ankle eversion     (Blank rows = not tested)  LOWER EXTREMITY MMT:  MMT Right  eval Left eval  Hip flexion 25.7 28.6  Hip extension    Hip abduction 35.1 36.6  Hip adduction    Hip internal rotation    Hip external rotation    Knee flexion    Knee extension 47.2 49.3  Ankle dorsiflexion    Ankle plantarflexion    Ankle inversion    Ankle eversion     (Blank rows = not tested)  LOWER EXTREMITY SPECIAL TESTS:  Hip special tests: Luisa Hart (FABER) test: negative  FUNCTIONAL TESTS:  Timed up and go (TUG): 14.91 5x STS 20.36  from bench at pool ue use 4/5 reps  2 min Walk test 33ft  GAIT: Distance walked: 325 ft Assistive device utilized: None Level of assistance: Complete  Independence Comments: forward hip flexed position, shorted step length, just clearing heels from floor                                                                                                                                TREATMENT     [facility] Adult PT Treatment:                                                DATE: 02/03/24 Pt seen for aquatic therapy today.  Treatment took place in water 3.5-4.75 ft in depth at the [facility] pool. Temp of water was 91.  Pt entered/exited the pool via stairs using step to pattern with hand rail.   *walking forward, back  in 3.6-unsupported.  VC for exaggerated knee flex and heel strike forward as well as backward with toe strike. *[place] 3.36ft *standing ue support yellow HB hip flex/ext; hip add/abd->crossing midline *tandem stance ue support yellow HB->dynamic ue add/abd x 10 leading R/L *farmers carry side stepping *side lunge x 3 widths ue support yellow HB (good challenge coordination and balance) *step tapping bottom step then 2nd step alternating pattern x 10 ea. Cues for increase  pacing  *STS progressed from water bench to 3rd step x 10->4th step x 3 (good execution limited last due to stress on knees) *yellow noodle press wide and staggered stances 4.23ft; wide stance positions-oblique set x 5 R/L *Unsupported 3.6 ft: toe raises; heel raises;    Pt requires the buoyancy and hydrostatic pressure of water for support, and to offload joints by unweighting joint load by at least 50 % in navel deep water and by at least 75-80% in chest to neck deep water.  Viscosity of the water is needed for resistance of strengthening. Water current perturbations provides challenge to standing balance requiring increased core activation.   [facility] Adult PT Treatment:  DATE: 01/20/24 Therapeutic Exercise: Red band hip abduction 2x12 seated  Red band hip ER w/ ball as fulcrum x12 Red band hip IR w/ ball as  fulcrum 2x10 Seated hip flexion red band 2x10 BIL HEP education + handout, rationale for interventions  Therapeutic Activity: STS from raised mat 4x5 cues for symmetrical WB Slow marches 2x5 BIL w/ UE support  cues for posture and pacing  Education/discussion re: pacing of activities, walking program      PATIENT EDUCATION:  Education details: rationale for interventions, HEP  Person educated: Patient Education method: Explanation, Demonstration, Tactile cues, Verbal cues Education comprehension: verbalized understanding, returned demonstration, verbal cues required, tactile cues required, and needs further education     HOME EXERCISE PROGRAM: Access Code: [email or web address] URL: [email or web address] Date: 01/20/2024 Prepared by: Fransisco Hertz  Exercises - Seated Hip Abduction with Resistance  - 2-3 x daily - 1 sets - 8-10 reps - Seated Hip Internal Rotation with Ball and Resistance  - 2-3 x daily - 1 sets - 8-10 reps - Standing March with Counter Support  - 2-3 x daily - 1 sets - 5-8 reps  ASSESSMENT:  CLINICAL IMPRESSION: Focus completely on balance with core stability. Good toleration of progression using added foam resistance and decreasing depth for increased loading.  Progressing very well towards stated goals.        Per eval - Patient is a 73 y.o. m who was seen today for physical therapy evaluation and treatment for Trochanteric bursitis of right hip . He has PmHx of bilateral knee pain which he recently received injections which has also improved his hip pain.  Testing demonstrates weak hip flexors and tight post core. He just clears feet from floor with amb and with shortened step length and slowed cadence.  He is a member here at [employer] but has not been exercising due to pain.  He is planning trip to [place] in may and wants to be able to ambulate without limitation if able.  We did discus a walking stick to be addressed in future sessions.  He will benefit  from skilled physical therapy both aquatic and land based to best progress strength and amb toleration preparing for his trip and improve his overall functional mobility/safety with ADL's.  OBJECTIVE IMPAIRMENTS: Abnormal gait, decreased activity tolerance, decreased balance, decreased endurance, decreased mobility, difficulty walking, decreased strength, postural dysfunction, obesity, and pain.   ACTIVITY LIMITATIONS: carrying, lifting, bending, sitting, standing, squatting, stairs, transfers, and locomotion level  PARTICIPATION LIMITATIONS: shopping, community activity, occupation, and yard work  PERSONAL FACTORS: Fitness and 1-2 comorbidities: see problem list  are also affecting patient's functional outcome.   REHAB POTENTIAL: Good  CLINICAL DECISION MAKING: Evolving/moderate complexity  EVALUATION COMPLEXITY: Moderate   GOALS: Goals reviewed with patient? Yes  SHORT TERM GOALS: Target date: 02/17/24 Pt will be indep with initial land based HEP (using [employer] equipment as approp) for continued management of condition Baseline:none Goal status: INITIAL  2.  Pt will tolerate full aquatic sessions consistently without increase in pain and with improving function to demonstrate good toleration and effectiveness of intervention.  Baseline:  Goal status: INITIAL  3.  Pt will perform tandem and SLS Baseline:  Goal status: INITIAL  4.  Pt will improve on Tug test to <or= 13 s to demonstrate improvement in lower extremity function, mobility and decreased fall risk. Baseline: 14.91 Goal status: INITIAL   LONG TERM GOALS: Target date: 03/23/24  Pt to improve on LEFS by at least 9 point to  demonstrate statistically significant Improvement in function. Baseline: 29/80 Goal status: INITIAL  2.  Pt will demonstrate community ambulation distances without limitation of pain or fatigue using AD as needed Baseline:  Goal status: INITIAL  3.  Pt will be indep with final HEP's (land and  aquatic as appropriate) for continued management of condition Baseline:  Goal status: INITIAL  4.  Pt will improve on the by at least 40 ft (MDC) or greater (544ft avg for age) to demonstrate improved functional capacity,endurance and gait speed Baseline: 325 ft Goal status: INITIAL  5.   Pt will reports 75% decrease in right hip pain to demonstrate improved function and effectiveness of intervention. Baseline:  Goal status: INITIAL   PLAN:  PT FREQUENCY: 2x/week  PT DURATION: 9 weeks  PLANNED INTERVENTIONS: 97164- PT Re-evaluation, 97110-Therapeutic exercises, 97530- Therapeutic activity, 97112- Neuromuscular re-education, 97535- Self Care, 81191- Manual therapy, [phone]- Gait training, [phone]- Orthotic Fit/training, [phone]- Ionotophoresis 4mg /ml Dexamethasone, Patient/Family education, Balance training, Stair training, Taping, Dry Needling, DME instructions, Cryotherapy, and Moist heat  PLAN FOR NEXT SESSION: alternate land/aquatics Land: would recommend doing a land visit coming up to work on gym machines as appropriate  Both: Hip and posterior core strengthening and stretching, gait and posture correction, body mechanics, aerobic capacity training.   [address] [place]) Nayleah Gamel MPT 02/03/24 8:10 AM [facility] [address] [place], [place], [address] Phone: [phone]   Fax:  [phone]

## 2024-02-07 ENCOUNTER — Encounter (HOSPITAL_BASED_OUTPATIENT_CLINIC_OR_DEPARTMENT_OTHER): Payer: Self-pay | Admitting: Physical Therapy

## 2024-02-07 ENCOUNTER — Ambulatory Visit (HOSPITAL_BASED_OUTPATIENT_CLINIC_OR_DEPARTMENT_OTHER): Admitting: Physical Therapy

## 2024-02-07 DIAGNOSIS — R2689 Other abnormalities of gait and mobility: Secondary | ICD-10-CM | POA: Diagnosis not present

## 2024-02-07 DIAGNOSIS — M25551 Pain in right hip: Secondary | ICD-10-CM

## 2024-02-07 DIAGNOSIS — M6281 Muscle weakness (generalized): Secondary | ICD-10-CM | POA: Diagnosis not present

## 2024-02-07 NOTE — Therapy (Signed)
 OUTPATIENT PHYSICAL THERAPY TREATMENT   Patient Name: Marc Serrano MRN: [id number] DOB:07/15/1951, 73 y.o., male Today's Date: 02/07/2024  END OF SESSION:  PT End of Session - 02/07/24 0811     Visit Number 6    Number of Visits 16    Date for PT Re-Evaluation 03/23/24    Authorization Type BCBS    PT Start Time 0800    PT Stop Time 0839    PT Time Calculation (min) 39 min    Activity Tolerance Patient tolerated treatment well    Behavior During Therapy [facility] for tasks assessed/performed              Past Medical History:  Diagnosis Date   Asthma    Cancer (HCC)    Diabetes mellitus without complication (HCC)    Hypertension    Past Surgical History:  Procedure Laterality Date   APPENDECTOMY     HEMORRHOID SURGERY     sphincterectomy rectal     TONSILLECTOMY     Patient Active Problem List   Diagnosis Date Noted   Acute bronchitis 01/11/2015   SIRS (systemic inflammatory response syndrome) (HCC) 01/10/2015   Diabetes mellitus type 2, uncontrolled 01/10/2015   Hypertension 01/10/2015    PCP: Merri Brunette, MD  REFERRING PROVIDER: Hurman Horn, MD   REFERRING DIAG: M70.61 (ICD-10-CM) - Trochanteric bursitis of right hip   THERAPY DIAG:  Right hip pain  Muscle weakness (generalized)  Other abnormalities of gait and mobility  Rationale for Evaluation and Treatment: Rehabilitation  ONSET DATE: 2-3 years  SUBJECTIVE:  SUBJECTIVE STATEMENT: "I'm no longer shuffling my feet".  Pt reports he has a trip planned to [place] in May.    Per eval - Injections in knees a few weeks ago. Hips and knees were hurting bad. Injections cleared up my hips better.  4-5 years ago I was walking 5 miles a day. Walking daily 01-4999 steps a day trying to get back up to 10000.  PERTINENT HISTORY: Bilateral knee pain PAIN:  Are you having pain? no: NPRS scale: 0/10    On eval -  worst 4/10; Pain location: right hip and lateral aspect of thigh/IT band Pain  description: dull ache Aggravating factors: walking > 12 minutes Relieving factors: rest, sitting, OTC med  PRECAUTIONS: None  RED FLAGS: None   WEIGHT BEARING RESTRICTIONS: No  FALLS:  Has patient fallen in last 6 months? 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  DIAGNOSTIC FINDINGS: nothing recent  PATIENT SURVEYS:  LEFS 29/80  COGNITION: Overall cognitive status: Within functional limits for tasks assessed     SENSATION: WFL   MUSCLE LENGTH: Hamstrings: bilaterally tight  POSTURE: rounded shoulders, forward head, decreased lumbar lordosis, and flexed trunk   PALPATION: TTP right hip/trochanter and throughout IT band  LOWER EXTREMITY ROM:  Active ROM Right eval Left eval  Hip flexion    Hip extension    Hip abduction    Hip adduction    Hip internal rotation 30d   Hip external rotation 30d   Knee flexion    Knee extension    Ankle dorsiflexion    Ankle plantarflexion    Ankle inversion    Ankle eversion     (  Blank rows = not tested)  LOWER EXTREMITY MMT:  MMT Right eval Left eval  Hip flexion 25.7 28.6  Hip extension    Hip abduction 35.1 36.6  Hip adduction    Hip internal rotation    Hip external rotation    Knee flexion    Knee extension 47.2 49.3  Ankle dorsiflexion    Ankle plantarflexion    Ankle inversion    Ankle eversion     (Blank rows = not tested)  LOWER EXTREMITY SPECIAL TESTS:  Hip special tests: Luisa Hart (FABER) test: negative  FUNCTIONAL TESTS:  Timed up and go (TUG): 14.91 5x STS 20.36  from bench at pool ue use 4/5 reps  2 min Walk test 344ft   02/07/24: SLS -LLE 4.46s, RLE 3.71;  Tandem stance LLE in  back 30+ sec, RLE in back 10.1 sec  GAIT: Distance walked: 325 ft Assistive device utilized: None Level of assistance: Complete Independence Comments: forward hip flexed position, shorted step length, just clearing heels from floor                                                                                                                                TREATMENT   [facility] Adult PT Treatment:                                                DATE: 02/07/24 Pt seen for aquatic therapy today.  Treatment took place in water 3.5-4.75 ft in depth at the [facility] pool. Temp of water was 91.  Pt entered/exited the pool via stairs using step to pattern with hand rail.  * on land:  SLS and tandem stance measurements  *walking forward/ backward  in 3.6-unsupported.   * side stepping with arm addct/ abdct with yellow hand floats x 1 lap-> in to wide squat x 2 laps * farmers carry with yellow hand floats at side and marching forward/ backward *UE support yellow hand floats: 3 way toe tap 2 x 5 each LE; Heel/toe raises x 10; hip flex/ext  x 10; hip add/abd x 10; tandem gait forward/ backward x 2 laps * wall push up/ push off 2 x 10 (single lap walking forward/ backwards in between sets) * SLS with hands on top of water (Up to 8-9 sec each LE) * R/L hamstring stretch with foot on 2nd step x 15s each   [facility] Adult PT Treatment:                                                DATE: 02/03/24 Pt seen for aquatic therapy today.  Treatment took place in water 3.5-4.75 ft in depth at  the [facility] pool. Temp of water was 91.  Pt entered/exited the pool via stairs using step to pattern with hand rail.   *walking forward, back  in 3.6-unsupported.  VC for exaggerated knee flex and heel strike forward as well as backward with toe strike. *[place] 3.78ft *standing ue support yellow HB hip flex/ext; hip add/abd->crossing midline *tandem stance ue support yellow HB->dynamic ue add/abd x 10  leading R/L *farmers carry side stepping *side lunge x 3 widths ue support yellow HB (good challenge coordination and balance) *step tapping bottom step then 2nd step alternating pattern x 10 ea. Cues for increase  pacing  *STS progressed from water bench to 3rd step x 10->4th step x 3 (good execution limited last due to stress on knees) *yellow noodle press wide and staggered stances 4.34ft; wide stance positions-oblique set x 5 R/L *Unsupported 3.6 ft: toe raises; heel raises;      [facility] Adult PT Treatment:                                                DATE: 01/20/24 Therapeutic Exercise: Red band hip abduction 2x12 seated  Red band hip ER w/ ball as fulcrum x12 Red band hip IR w/ ball as fulcrum 2x10 Seated hip flexion red band 2x10 BIL HEP education + handout, rationale for interventions  Therapeutic Activity: STS from raised mat 4x5 cues for symmetrical WB Slow marches 2x5 BIL w/ UE support  cues for posture and pacing  Education/discussion re: pacing of activities, walking program      PATIENT EDUCATION:  Education details: rationale for interventions, HEP  Person educated: Patient Education method: Explanation, Demonstration, Tactile cues, Verbal cues Education comprehension: verbalized understanding, returned demonstration, verbal cues required, tactile cues required, and needs further education     HOME EXERCISE PROGRAM: Access Code: [email or web address] URL: [email or web address] Date: 01/20/2024 Prepared by: Fransisco Hertz  Exercises - Seated Hip Abduction with Resistance  - 2-3 x daily - 1 sets - 8-10 reps - Seated Hip Internal Rotation with Ball and Resistance  - 2-3 x daily - 1 sets - 8-10 reps - Standing March with Counter Support  - 2-3 x daily - 1 sets - 5-8 reps  ASSESSMENT:  CLINICAL IMPRESSION: Pt demonstrated decreased balance in SLS bilat and R tandem stance on land when tested. Pt tolerated session well, without production of pain. Will continue to  progress as tolerated.  Pt has met STG 2 snd 3.         Per eval - Patient is a 73 y.o. m who was seen today for physical therapy evaluation and treatment for Trochanteric bursitis of right hip . He has PmHx of bilateral knee pain which he recently received injections which has also improved his hip pain.  Testing demonstrates weak hip flexors and tight post core. He just clears feet from floor with amb and with shortened step length and slowed cadence.  He is a member here at [employer] but has not been exercising due to pain.  He is planning trip to [place] in may and wants to be able to ambulate without limitation if able.  We did discus a walking stick to be addressed in future sessions.  He will benefit from skilled physical therapy both aquatic and land based to best progress strength and amb toleration preparing for his trip and improve his overall  functional mobility/safety with ADL's.  OBJECTIVE IMPAIRMENTS: Abnormal gait, decreased activity tolerance, decreased balance, decreased endurance, decreased mobility, difficulty walking, decreased strength, postural dysfunction, obesity, and pain.   ACTIVITY LIMITATIONS: carrying, lifting, bending, sitting, standing, squatting, stairs, transfers, and locomotion level  PARTICIPATION LIMITATIONS: shopping, community activity, occupation, and yard work  PERSONAL FACTORS: Fitness and 1-2 comorbidities: see problem list  are also affecting patient's functional outcome.   REHAB POTENTIAL: Good  CLINICAL DECISION MAKING: Evolving/moderate complexity  EVALUATION COMPLEXITY: Moderate   GOALS: Goals reviewed with patient? Yes  SHORT TERM GOALS: Target date: 02/17/24 Pt will be indep with initial land based HEP (using [employer] equipment as approp) for continued management of condition Baseline:none Goal status: INITIAL  2.  Pt will tolerate full aquatic sessions consistently without increase in pain and with improving function to demonstrate good  toleration and effectiveness of intervention.  Baseline:  Goal status:MET - 02/07/24  3.  Pt will perform tandem and SLS Baseline: see above Goal status: MET -02/07/24  4.  Pt will improve on Tug test to <or= 13 s to demonstrate improvement in lower extremity function, mobility and decreased fall risk. Baseline: 14.91 Goal status: INITIAL   LONG TERM GOALS: Target date: 03/23/24  Pt to improve on LEFS by at least 9 point to demonstrate statistically significant Improvement in function. Baseline: 29/80 Goal status: INITIAL  2.  Pt will demonstrate community ambulation distances without limitation of pain or fatigue using AD as needed Baseline:  Goal status: INITIAL  3.  Pt will be indep with final HEP's (land and aquatic as appropriate) for continued management of condition Baseline:  Goal status: INITIAL  4.  Pt will improve on the by at least 40 ft (MDC) or greater (529ft avg for age) to demonstrate improved functional capacity,endurance and gait speed Baseline: 325 ft Goal status: INITIAL  5.   Pt will reports 75% decrease in right hip pain to demonstrate improved function and effectiveness of intervention. Baseline:  Goal status: INITIAL   PLAN:  PT FREQUENCY: 2x/week  PT DURATION: 9 weeks  PLANNED INTERVENTIONS: 97164- PT Re-evaluation, 97110-Therapeutic exercises, 97530- Therapeutic activity, 97112- Neuromuscular re-education, 97535- Self Care, 16109- Manual therapy, [phone]- Gait training, [phone]- Orthotic Fit/training, [phone]- Ionotophoresis 4mg /ml Dexamethasone, Patient/Family education, Balance training, Stair training, Taping, Dry Needling, DME instructions, Cryotherapy, and Moist heat  PLAN FOR NEXT SESSION: alternate land/aquatics Land: would recommend doing a land visit coming up to work on gym machines as appropriate  Both: Hip and posterior core strengthening and stretching, gait and posture correction, body mechanics, aerobic capacity training.   Mayer Camel, PTA 02/07/24 8:47 AM [facility] [address] [place], [place], [address] Phone: [phone]   Fax:  [phone]

## 2024-02-10 ENCOUNTER — Ambulatory Visit (HOSPITAL_BASED_OUTPATIENT_CLINIC_OR_DEPARTMENT_OTHER): Admitting: Physical Therapy

## 2024-02-10 DIAGNOSIS — M25551 Pain in right hip: Secondary | ICD-10-CM | POA: Diagnosis not present

## 2024-02-10 DIAGNOSIS — M6281 Muscle weakness (generalized): Secondary | ICD-10-CM

## 2024-02-10 DIAGNOSIS — R2689 Other abnormalities of gait and mobility: Secondary | ICD-10-CM | POA: Diagnosis not present

## 2024-02-10 NOTE — Therapy (Signed)
 OUTPATIENT PHYSICAL THERAPY TREATMENT   Patient Name: Marc Serrano MRN: [id number] DOB:08/11/1951, 73 y.o., male Today's Date: 02/10/2024  END OF SESSION:  PT End of Session - 02/10/24 0808     Visit Number 7    Number of Visits 16    Date for PT Re-Evaluation 03/23/24    Authorization Type BCBS    PT Start Time 0801    PT Stop Time 0839    PT Time Calculation (min) 38 min    Activity Tolerance Patient tolerated treatment well    Behavior During Therapy [facility] for tasks assessed/performed              Past Medical History:  Diagnosis Date   Asthma    Cancer (HCC)    Diabetes mellitus without complication (HCC)    Hypertension    Past Surgical History:  Procedure Laterality Date   APPENDECTOMY     HEMORRHOID SURGERY     sphincterectomy rectal     TONSILLECTOMY     Patient Active Problem List   Diagnosis Date Noted   Acute bronchitis 01/11/2015   SIRS (systemic inflammatory response syndrome) (HCC) 01/10/2015   Diabetes mellitus type 2, uncontrolled 01/10/2015   Hypertension 01/10/2015    PCP: Merri Brunette, MD  REFERRING PROVIDER: Hurman Horn, MD   REFERRING DIAG: M70.61 (ICD-10-CM) - Trochanteric bursitis of right hip   THERAPY DIAG:  Right hip pain  Muscle weakness (generalized)  Other abnormalities of gait and mobility  Rationale for Evaluation and Treatment: Rehabilitation  ONSET DATE: 2-3 years  SUBJECTIVE:  SUBJECTIVE STATEMENT: "I had some exercise soreness the next day.  Today, my legs feel stiff".    Per eval - Injections in knees a few weeks ago. Hips and knees were hurting bad. Injections cleared up my hips better.  4-5 years ago I was walking 5 miles a day. Walking daily 01-4999 steps a day trying to get back up to 10000.  PERTINENT HISTORY: Bilateral knee pain PAIN:  Are you having pain? no: NPRS scale: 0/10 , just stiffness in LEs  On eval -  worst 4/10; Pain location: right hip and lateral aspect of thigh/IT band Pain  description: dull ache Aggravating factors: walking > 12 minutes Relieving factors: rest, sitting, OTC med  PRECAUTIONS: None  RED FLAGS: None   WEIGHT BEARING RESTRICTIONS: No  FALLS:  Has patient fallen in last 6 months? 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  DIAGNOSTIC FINDINGS: nothing recent  PATIENT SURVEYS:  LEFS 29/80  COGNITION: Overall cognitive status: Within functional limits for tasks assessed     SENSATION: WFL   MUSCLE LENGTH: Hamstrings: bilaterally tight  POSTURE: rounded shoulders, forward head, decreased lumbar lordosis, and flexed trunk   PALPATION: TTP right hip/trochanter and throughout IT band  LOWER EXTREMITY ROM:  Active ROM Right eval Left eval  Hip flexion    Hip extension    Hip abduction    Hip adduction    Hip internal rotation 30d   Hip external rotation 30d   Knee flexion    Knee extension    Ankle dorsiflexion    Ankle plantarflexion    Ankle inversion    Ankle eversion     (  Blank rows = not tested)  LOWER EXTREMITY MMT:  MMT Right eval Left eval  Hip flexion 25.7 28.6  Hip extension    Hip abduction 35.1 36.6  Hip adduction    Hip internal rotation    Hip external rotation    Knee flexion    Knee extension 47.2 49.3  Ankle dorsiflexion    Ankle plantarflexion    Ankle inversion    Ankle eversion     (Blank rows = not tested)  LOWER EXTREMITY SPECIAL TESTS:  Hip special tests: Luisa Hart (FABER) test: negative  FUNCTIONAL TESTS:  Timed up and go (TUG): 14.91 5x STS 20.36  from bench at pool ue use 4/5 reps  2 min Walk test 369ft   02/07/24: SLS -LLE 4.46s, RLE 3.71;  Tandem stance LLE in  back 30+ sec, RLE in back 10.1 sec  02/10/24: TUG 11.96s GAIT: Distance walked: 325 ft Assistive device utilized: None Level of assistance: Complete Independence Comments: forward hip flexed position, shorted step length, just clearing heels from floor                                                                                                                                TREATMENT  [facility] Adult PT Treatment:                                                DATE: 02/10/24 Pt seen for aquatic therapy today.  Treatment took place in water 3.5-4.75 ft in depth at the [facility] pool. Temp of water was 91.  Pt entered/exited the pool via stairs using step to pattern with hand rail.  * on land:  TUG measurement  *walking forward 4 ft , 6-unsupported, multiple laps;  backwards x 2 laps   * side stepping with arm addct/ abdct with yellow hand floats x 1 lap-> in to wide squat x 2 laps * farmers carry with single blue hand floats at side and marching backward * wall push up/ push off 2 x 10 (single lap walking forward/ backwards in between sets) *UE support yellow hand floats: toe raises x 10;  SLS x 20s each LE; hip add/abd x15; hip flex/ext  x 10; tandem gait forward/ backward x 2 laps; 3 way toe tap x 10 each LE (good challenge); * forward marching 1 lap * STS on 3rd step in water * R/L hamstring stretch with foot on 2nd step x 15s each x 2     Previous Land:  [facility] Adult PT Treatment:                                                DATE: 01/20/24  Therapeutic Exercise: Red band hip abduction 2x12 seated  Red band hip ER w/ ball as fulcrum x12 Red band hip IR w/ ball as fulcrum 2x10 Seated hip flexion red band 2x10 BIL HEP education + handout, rationale for interventions  Therapeutic Activity: STS from raised mat 4x5 cues for symmetrical WB Slow marches 2x5 BIL w/ UE support  cues for posture and pacing  Education/discussion re: pacing of activities, walking program       PATIENT EDUCATION:  Education details: rationale for interventions, HEP  Person educated: Patient Education method: Explanation, Demonstration, Tactile cues, Verbal cues Education comprehension: verbalized understanding, returned demonstration, verbal cues required, tactile cues required, and needs further education     HOME EXERCISE PROGRAM: Access Code: [email or web address] URL: [email or web address] Date: 01/20/2024 Prepared by: Fransisco Hertz  Exercises - Seated Hip Abduction with Resistance  - 2-3 x daily - 1 sets - 8-10 reps - Seated Hip Internal Rotation with Ball and Resistance  - 2-3 x daily - 1 sets - 8-10 reps - Standing March with Counter Support  - 2-3 x daily - 1 sets - 5-8 reps  ASSESSMENT:  CLINICAL IMPRESSION: SLS and narrow BOS exercise continue to provide good balance challenge in 3.5-4" depth of water. Pt tolerated session well, without production of pain. Will continue to progress as tolerated.  Pt has met STG 4 with improved TUG time. Will plan to work on R step ups next session, along with calf and quad stretch.         Per eval - Patient is a 73 y.o. m who was seen today for physical therapy evaluation and treatment for Trochanteric bursitis of right hip . He has PmHx of bilateral knee pain which he recently received injections which has also improved his hip pain.  Testing demonstrates weak hip flexors and tight post core. He just clears feet from floor with amb and with shortened step length and slowed cadence.  He is a member here at [employer] but has not been exercising due to pain.  He is planning trip to [place] in may and wants to be able to ambulate without limitation if able.  We did discus a walking stick to be addressed in future sessions.  He will benefit from skilled physical therapy both aquatic and land based to best progress strength and amb toleration preparing for his trip and improve his overall functional mobility/safety with  ADL's.  OBJECTIVE IMPAIRMENTS: Abnormal gait, decreased activity tolerance, decreased balance, decreased endurance, decreased mobility, difficulty walking, decreased strength, postural dysfunction, obesity, and pain.   ACTIVITY LIMITATIONS: carrying, lifting, bending, sitting, standing, squatting, stairs, transfers, and locomotion level  PARTICIPATION LIMITATIONS: shopping, community activity, occupation, and yard work  PERSONAL FACTORS: Fitness and 1-2 comorbidities: see problem list  are also affecting patient's functional outcome.   REHAB POTENTIAL: Good  CLINICAL DECISION MAKING: Evolving/moderate complexity  EVALUATION COMPLEXITY: Moderate   GOALS: Goals reviewed with patient? Yes  SHORT TERM GOALS: Target date: 02/17/24 Pt will be indep with initial land based HEP (using [employer] equipment as approp) for continued management of condition Baseline:none Goal status: INITIAL  2.  Pt will tolerate full aquatic sessions consistently without increase in pain and with improving function to demonstrate good toleration and effectiveness of intervention.  Baseline:  Goal status:MET - 02/07/24  3.  Pt will perform tandem and SLS Baseline: see above Goal status: MET -02/07/24  4.  Pt will improve on Tug test to <or= 13 s to demonstrate improvement in lower extremity function,  mobility and decreased fall risk. Baseline: 14.91 at eval; see above Goal status: MET - 02/10/24   LONG TERM GOALS: Target date: 03/23/24  Pt to improve on LEFS by at least 9 point to demonstrate statistically significant Improvement in function. Baseline: 29/80 Goal status: INITIAL  2.  Pt will demonstrate community ambulation distances without limitation of pain or fatigue using AD as needed Baseline:  Goal status: INITIAL  3.  Pt will be indep with final HEP's (land and aquatic as appropriate) for continued management of condition Baseline:  Goal status: INITIAL  4.  Pt will improve on the by at  least 40 ft (MDC) or greater (567ft avg for age) to demonstrate improved functional capacity,endurance and gait speed Baseline: 325 ft Goal status: INITIAL  5.   Pt will reports 75% decrease in right hip pain to demonstrate improved function and effectiveness of intervention. Baseline:  Goal status: INITIAL   PLAN:  PT FREQUENCY: 2x/week  PT DURATION: 9 weeks  PLANNED INTERVENTIONS: 97164- PT Re-evaluation, 97110-Therapeutic exercises, 97530- Therapeutic activity, 97112- Neuromuscular re-education, 97535- Self Care, 86578- Manual therapy, [phone]- Gait training, [phone]- Orthotic Fit/training, [phone]- Ionotophoresis 4mg /ml Dexamethasone, Patient/Family education, Balance training, Stair training, Taping, Dry Needling, DME instructions, Cryotherapy, and Moist heat  PLAN FOR NEXT SESSION: alternate land/aquatics Land: would recommend doing a land visit coming up to work on gym machines as appropriate  Both: Hip and posterior core strengthening and stretching, gait and posture correction, body mechanics, aerobic capacity training.  Mayer Camel, PTA 02/10/24 8:40 AM [facility] [address] [place], [place], [address] Phone: [phone]   Fax:  [phone]

## 2024-02-13 ENCOUNTER — Ambulatory Visit (HOSPITAL_BASED_OUTPATIENT_CLINIC_OR_DEPARTMENT_OTHER): Admitting: Physical Therapy

## 2024-02-13 ENCOUNTER — Encounter (HOSPITAL_BASED_OUTPATIENT_CLINIC_OR_DEPARTMENT_OTHER): Payer: Self-pay | Admitting: Physical Therapy

## 2024-02-13 DIAGNOSIS — M25551 Pain in right hip: Secondary | ICD-10-CM | POA: Diagnosis not present

## 2024-02-13 DIAGNOSIS — R2689 Other abnormalities of gait and mobility: Secondary | ICD-10-CM | POA: Diagnosis not present

## 2024-02-13 DIAGNOSIS — M6281 Muscle weakness (generalized): Secondary | ICD-10-CM

## 2024-02-13 NOTE — Therapy (Signed)
 OUTPATIENT PHYSICAL THERAPY TREATMENT   Patient Name: Marc Serrano MRN: [id number] DOB:1951/09/02, 73 y.o., male Today's Date: 02/13/2024  END OF SESSION:  PT End of Session - 02/13/24 0808     Visit Number 8    Number of Visits 16    Date for PT Re-Evaluation 03/23/24    Authorization Type BCBS    PT Start Time 0802    PT Stop Time 0840    PT Time Calculation (min) 38 min    Activity Tolerance Patient tolerated treatment well    Behavior During Therapy WFL for tasks assessed/performed              Past Medical History:  Diagnosis Date   Asthma    Cancer (HCC)    Diabetes mellitus without complication (HCC)    Hypertension    Past Surgical History:  Procedure Laterality Date   APPENDECTOMY     HEMORRHOID SURGERY     sphincterectomy rectal     TONSILLECTOMY     Patient Active Problem List   Diagnosis Date Noted   Acute bronchitis 01/11/2015   SIRS (systemic inflammatory response syndrome) (HCC) 01/10/2015   Diabetes mellitus type 2, uncontrolled 01/10/2015   Hypertension 01/10/2015    PCP: Merri Brunette, MD  REFERRING PROVIDER: Hurman Horn, MD   REFERRING DIAG: M70.61 (ICD-10-CM) - Trochanteric bursitis of right hip   THERAPY DIAG:  Right hip pain  Muscle weakness (generalized)  Other abnormalities of gait and mobility  Rationale for Evaluation and Treatment: Rehabilitation  ONSET DATE: 2-3 years  SUBJECTIVE:  SUBJECTIVE STATEMENT: "I had some exercise soreness the next day.  Today, my legs feel stiff".    Per eval - Injections in knees a few weeks ago. Hips and knees were hurting bad. Injections cleared up my hips better.  4-5 years ago I was walking 5 miles a day. Walking daily 01-4999 steps a day trying to get back up to 10000.  PERTINENT HISTORY: Bilateral knee pain PAIN:  Are you having pain? no: NPRS scale: 0/10 , just stiffness in LEs  On eval -  worst 4/10; Pain location: right hip and lateral aspect of thigh/IT band Pain  description: dull ache Aggravating factors: walking > 12 minutes Relieving factors: rest, sitting, OTC med  PRECAUTIONS: None  RED FLAGS: None   WEIGHT BEARING RESTRICTIONS: No  FALLS:  Has patient fallen in last 6 months? 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  DIAGNOSTIC FINDINGS: nothing recent  PATIENT SURVEYS:  LEFS 29/80  COGNITION: Overall cognitive status: Within functional limits for tasks assessed     SENSATION: WFL   MUSCLE LENGTH: Hamstrings: bilaterally tight  POSTURE: rounded shoulders, forward head, decreased lumbar lordosis, and flexed trunk   PALPATION: TTP right hip/trochanter and throughout IT band  LOWER EXTREMITY ROM:  Active ROM Right eval Left eval  Hip flexion    Hip extension    Hip abduction    Hip adduction    Hip internal rotation 30d   Hip external rotation 30d   Knee flexion    Knee extension    Ankle dorsiflexion    Ankle plantarflexion    Ankle inversion    Ankle eversion     (  Blank rows = not tested)  LOWER EXTREMITY MMT:  MMT Right eval Left eval  Hip flexion 25.7 28.6  Hip extension    Hip abduction 35.1 36.6  Hip adduction    Hip internal rotation    Hip external rotation    Knee flexion    Knee extension 47.2 49.3  Ankle dorsiflexion    Ankle plantarflexion    Ankle inversion    Ankle eversion     (Blank rows = not tested)  LOWER EXTREMITY SPECIAL TESTS:  Hip special tests: Portia Brittle (FABER) test: negative  FUNCTIONAL TESTS:  Timed up and go (TUG): 14.91 5x STS 20.36  from bench at pool ue use 4/5 reps  2 min Walk test 324ft   02/07/24: SLS -LLE 4.46s, RLE 3.71;  Tandem stance LLE in  back 30+ sec, RLE in back 10.1 sec  02/10/24: TUG 11.96s GAIT: Distance walked: 325 ft Assistive device utilized: None Level of assistance: Complete Independence Comments: forward hip flexed position, shorted step length, just clearing heels from floor                                                                                                                                TREATMENT  [facility] Adult PT Treatment:                                                DATE: 02/13/24 Pt seen for aquatic therapy today.  Treatment took place in water 3.5-4.75 ft in depth at the [facility] pool. Temp of water was 91.  Pt entered/exited the pool via stairs using step to pattern with hand rail.  * on land:  Gait training  *walking forward, back and side stepping 4 ft , 6-unsupported, multiple laps;  *Farmers carry bilateral yellow HB cues for engaged core * side stepping with arm addct/ abdct with yellow hand floats x 1 lap-> in to wide squat x 2 laps * R/L hamstring stretch with foot on 2nd step x 15s each x 2 *hip hiking R and L 2 x 20. Cues for slowed pacing *resisted row using ride band elbow straight then flexed. Cues for engaged glutes and abd bracing throughout. *step ups leading left ue support handrails. Right/left x 10 unsupported *toe tapping alternating bottom step x12 then 2nd step x 10. 2-3 minot LOB recovered indep. * STS on 3rd step in water x 3 indep. Added spiky ball hold decreasing BOS increasing immediate balance challenge.        Previous Land:  [facility] Adult PT Treatment:  DATE: 01/20/24 Therapeutic Exercise: Red band hip abduction 2x12 seated  Red band hip ER w/ ball as fulcrum x12 Red band hip IR w/ ball as fulcrum 2x10 Seated hip flexion red band 2x10 BIL HEP education + handout, rationale for interventions  Therapeutic Activity: STS from raised mat 4x5 cues for symmetrical WB Slow marches 2x5 BIL w/ UE support  cues  for posture and pacing  Education/discussion re: pacing of activities, walking program      PATIENT EDUCATION:  Education details: rationale for interventions, HEP  Person educated: Patient Education method: Explanation, Demonstration, Tactile cues, Verbal cues Education comprehension: verbalized understanding, returned demonstration, verbal cues required, tactile cues required, and needs further education     HOME EXERCISE PROGRAM: Access Code: [email or web address] URL: [email or web address] Date: 01/20/2024 Prepared by: Mayme Spearman  Exercises - Seated Hip Abduction with Resistance  - 2-3 x daily - 1 sets - 8-10 reps - Seated Hip Internal Rotation with Ball and Resistance  - 2-3 x daily - 1 sets - 8-10 reps - Standing March with Counter Support  - 2-3 x daily - 1 sets - 5-8 reps  02/13/24 - walking at [place] 1-2 x around (1700 steps as per pt).  He is encouraged to rest as needed on benches and time how long it takes to get around reporting back to PT.  MFZ  ASSESSMENT:  CLINICAL IMPRESSION: Pt reports compliance with HEP.  He is instructed on walking program to begin today to build endurance preparing for trip to [place].  Gait pattern has normalized demonstrating increase to normal step length with comfortably arm swing and cadence improved. Does have slight trendelenburg on left. Focus post core/gluts and balance today. Very good tolerance. Pt not apprehensive to engage in SLS positions. Decreasing BOS with sit to stand good immediate balance challenge. Goals ongoing        Per eval - Patient is a 73 y.o. m who was seen today for physical therapy evaluation and treatment for Trochanteric bursitis of right hip . He has PmHx of bilateral knee pain which he recently received injections which has also improved his hip pain.  Testing demonstrates weak hip flexors and tight post core. He just clears feet from floor with amb and with shortened step length and slowed  cadence.  He is a member here at [employer] but has not been exercising due to pain.  He is planning trip to [place] in may and wants to be able to ambulate without limitation if able.  We did discus a walking stick to be addressed in future sessions.  He will benefit from skilled physical therapy both aquatic and land based to best progress strength and amb toleration preparing for his trip and improve his overall functional mobility/safety with ADL's.  OBJECTIVE IMPAIRMENTS: Abnormal gait, decreased activity tolerance, decreased balance, decreased endurance, decreased mobility, difficulty walking, decreased strength, postural dysfunction, obesity, and pain.   ACTIVITY LIMITATIONS: carrying, lifting, bending, sitting, standing, squatting, stairs, transfers, and locomotion level  PARTICIPATION LIMITATIONS: shopping, community activity, occupation, and yard work  PERSONAL FACTORS: Fitness and 1-2 comorbidities: see problem list  are also affecting patient's functional outcome.   REHAB POTENTIAL: Good  CLINICAL DECISION MAKING: Evolving/moderate complexity  EVALUATION COMPLEXITY: Moderate   GOALS: Goals reviewed with patient? Yes  SHORT TERM GOALS: Target date: 02/17/24 Pt will be indep with initial land based HEP (using [employer] equipment as approp) for continued management of condition Baseline:none Goal status: INITIAL  2.  Pt will tolerate full aquatic  sessions consistently without increase in pain and with improving function to demonstrate good toleration and effectiveness of intervention.  Baseline:  Goal status:MET - 02/07/24  3.  Pt will perform tandem and SLS Baseline: see above Goal status: MET -02/07/24  4.  Pt will improve on Tug test to <or= 13 s to demonstrate improvement in lower extremity function, mobility and decreased fall risk. Baseline: 14.91 at eval; see above Goal status: MET - 02/10/24   LONG TERM GOALS: Target date: 03/23/24  Pt to improve on LEFS by at least 9  point to demonstrate statistically significant Improvement in function. Baseline: 29/80 Goal status: INITIAL  2.  Pt will demonstrate community ambulation distances without limitation of pain or fatigue using AD as needed Baseline:  Goal status: INITIAL  3.  Pt will be indep with final HEP's (land and aquatic as appropriate) for continued management of condition Baseline:  Goal status: INITIAL  4.  Pt will improve on the by at least 40 ft (MDC) or greater (564ft avg for age) to demonstrate improved functional capacity,endurance and gait speed Baseline: 325 ft Goal status: INITIAL  5.   Pt will reports 75% decrease in right hip pain to demonstrate improved function and effectiveness of intervention. Baseline:  Goal status: INITIAL   PLAN:  PT FREQUENCY: 2x/week  PT DURATION: 9 weeks  PLANNED INTERVENTIONS: 97164- PT Re-evaluation, 97110-Therapeutic exercises, 97530- Therapeutic activity, 97112- Neuromuscular re-education, 97535- Self Care, 11914- Manual therapy, [phone]- Gait training, [phone]- Orthotic Fit/training, [phone]- Ionotophoresis 4mg /ml Dexamethasone, Patient/Family education, Balance training, Stair training, Taping, Dry Needling, DME instructions, Cryotherapy, and Moist heat  PLAN FOR NEXT SESSION: alternate land/aquatics Land: would recommend doing a land visit coming up to work on gym machines as appropriate  Both: Hip and posterior core strengthening and stretching, gait and posture correction, body mechanics, aerobic capacity training.  [address] [place]) Modestine Scherzinger MPT 02/13/24 8:09 AM [facility] [address] [place], [place], [address] Phone: [phone]   Fax:  [phone]

## 2024-02-15 ENCOUNTER — Encounter (HOSPITAL_BASED_OUTPATIENT_CLINIC_OR_DEPARTMENT_OTHER): Payer: Self-pay | Admitting: Physical Therapy

## 2024-02-15 ENCOUNTER — Ambulatory Visit (HOSPITAL_BASED_OUTPATIENT_CLINIC_OR_DEPARTMENT_OTHER): Admitting: Physical Therapy

## 2024-02-15 DIAGNOSIS — M25551 Pain in right hip: Secondary | ICD-10-CM

## 2024-02-15 DIAGNOSIS — M6281 Muscle weakness (generalized): Secondary | ICD-10-CM

## 2024-02-15 DIAGNOSIS — R2689 Other abnormalities of gait and mobility: Secondary | ICD-10-CM | POA: Diagnosis not present

## 2024-02-15 NOTE — Therapy (Signed)
 OUTPATIENT PHYSICAL THERAPY TREATMENT   Patient Name: Marc Serrano MRN: [id number] DOB:04-19-1951, 73 y.o., male Today's Date: 02/15/2024  END OF SESSION:  PT End of Session - 02/15/24 0855     Visit Number 9    Number of Visits 16    Date for PT Re-Evaluation 03/23/24    Authorization Type BCBS    PT Start Time 0801    PT Stop Time 0843    PT Time Calculation (min) 42 min    Activity Tolerance Patient tolerated treatment well    Behavior During Therapy [facility] for tasks assessed/performed               Past Medical History:  Diagnosis Date   Asthma    Cancer (HCC)    Diabetes mellitus without complication (HCC)    Hypertension    Past Surgical History:  Procedure Laterality Date   APPENDECTOMY     HEMORRHOID SURGERY     sphincterectomy rectal     TONSILLECTOMY     Patient Active Problem List   Diagnosis Date Noted   Acute bronchitis 01/11/2015   SIRS (systemic inflammatory response syndrome) (HCC) 01/10/2015   Diabetes mellitus type 2, uncontrolled 01/10/2015   Hypertension 01/10/2015    PCP: Merri Brunette, MD  REFERRING PROVIDER: Hurman Horn, MD   REFERRING DIAG: M70.61 (ICD-10-CM) - Trochanteric bursitis of right hip   THERAPY DIAG:  Right hip pain  Muscle weakness (generalized)  Other abnormalities of gait and mobility  Rationale for Evaluation and Treatment: Rehabilitation  ONSET DATE: 2-3 years  SUBJECTIVE:  SUBJECTIVE STATEMENT: "I did feel like I had worked out after last session.  I took it as a good sign.".    Per eval - Injections in knees a few weeks ago. Hips and knees were hurting bad. Injections cleared up my hips better.  4-5 years ago I was walking 5 miles a day. Walking daily 01-4999 steps a day trying to get back up to 10000.  PERTINENT HISTORY: Bilateral knee pain PAIN:  Are you having pain? no: NPRS scale: 0/10 , just stiffness in LEs  On eval -  worst 4/10; Pain location: right hip and lateral aspect of thigh/IT  band Pain description: dull ache Aggravating factors: walking > 12 minutes Relieving factors: rest, sitting, OTC med  PRECAUTIONS: None  RED FLAGS: None   WEIGHT BEARING RESTRICTIONS: No  FALLS:  Has patient fallen in last 6 months? 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  DIAGNOSTIC FINDINGS: nothing recent  PATIENT SURVEYS:  LEFS 29/80  COGNITION: Overall cognitive status: Within functional limits for tasks assessed     SENSATION: WFL   MUSCLE LENGTH: Hamstrings: bilaterally tight  POSTURE: rounded shoulders, forward head, decreased lumbar lordosis, and flexed trunk   PALPATION: TTP right hip/trochanter and throughout IT band  LOWER EXTREMITY ROM:  Active ROM Right eval Left eval  Hip flexion    Hip extension    Hip abduction    Hip adduction    Hip internal rotation 30d   Hip external rotation 30d   Knee flexion    Knee extension    Ankle dorsiflexion    Ankle plantarflexion    Ankle inversion  Ankle eversion     (Blank rows = not tested)  LOWER EXTREMITY MMT:  MMT Right eval Left eval  Hip flexion 25.7 28.6  Hip extension    Hip abduction 35.1 36.6  Hip adduction    Hip internal rotation    Hip external rotation    Knee flexion    Knee extension 47.2 49.3  Ankle dorsiflexion    Ankle plantarflexion    Ankle inversion    Ankle eversion     (Blank rows = not tested)  LOWER EXTREMITY SPECIAL TESTS:  Hip special tests: Portia Brittle (FABER) test: negative  FUNCTIONAL TESTS:  Timed up and go (TUG): 14.91 5x STS 20.36  from bench at pool ue use 4/5 reps  2 min Walk test 326ft   02/07/24: SLS -LLE 4.46s, RLE 3.71;  Tandem  stance LLE in back 30+ sec, RLE in back 10.1 sec  02/10/24: TUG 11.96s GAIT: Distance walked: 325 ft Assistive device utilized: None Level of assistance: Complete Independence Comments: forward hip flexed position, shorted step length, just clearing heels from floor                                                                                                                                TREATMENT  [facility] Adult PT Treatment:                                                DATE: 02/15/24 Pt seen for aquatic therapy today.  Treatment took place in water 3.5-4.75 ft in depth at the [facility] pool. Temp of water was 91.  Pt entered/exited the pool via stairs using step to pattern with hand rail.   *walking forward, back and side stepping 4 ft , 6-unsupported, multiple laps;  *Farmers carry bilateral yellow-blue HB cues for engaged core * side stepping with arm addct/ abdct with yellow hand floats x 1 lap-> in to wide squat x 2 laps * R/L hamstring stretch with foot on 2nd step x 15s each x 2 *hip hiking R and L 2 x 20. Cues for slowed pacing *resisted row using ride band elbow straight then flexed 2 x 10 ea position. Cues for engaged glutes and abd bracing throughout. *toe tapping alternating bottom step x12 then 2nd step x 10. 2-3 minot LOB recovered indep. *step ups leading left ue support handrails. Right/left x 10 opposite hand support      Previous Land:  [facility] Adult PT Treatment:                                                DATE: 01/20/24 Therapeutic Exercise: Red band hip abduction 2x12 seated  Red band  hip ER w/ ball as fulcrum x12 Red band hip IR w/ ball as fulcrum 2x10 Seated hip flexion red band 2x10 BIL HEP education + handout, rationale for interventions  Therapeutic Activity: STS from raised mat 4x5 cues for symmetrical WB Slow marches 2x5 BIL w/ UE support  cues for posture and pacing  Education/discussion re: pacing of activities, walking program       PATIENT EDUCATION:  Education details: rationale for interventions, HEP  Person educated: Patient Education method: Explanation, Demonstration, Tactile cues, Verbal cues Education comprehension: verbalized understanding, returned demonstration, verbal cues required, tactile cues required, and needs further education     HOME EXERCISE PROGRAM: Access Code: [email or web address] URL: [email or web address] Date: 01/20/2024 Prepared by: Mayme Spearman  Exercises - Seated Hip Abduction with Resistance  - 2-3 x daily - 1 sets - 8-10 reps - Seated Hip Internal Rotation with Ball and Resistance  - 2-3 x daily - 1 sets - 8-10 reps - Standing March with Counter Support  - 2-3 x daily - 1 sets - 5-8 reps  02/13/24 - walking at [place] 1-2 x around (1700 steps as per pt).  He is encouraged to rest as needed on benches and time how long it takes to get around reporting back to PT.  MFZ  ASSESSMENT:  CLINICAL IMPRESSION: Able to schedule appt land based next week, a few weeks sooner than originally anticipated as I do believe pt is ready to begin increasing load with exercise and work on gait to prepare for coming trip next month.  He is instructed to bring in his cane so we can assure it is adjusted properly.  He does complain of some minor right knee pain with step ups which is reduced upon stopping movement.  He continues to progress well and tolerate advancement of program meeting goals.        Per eval - Patient is a 74 y.o. m who was seen today for physical therapy evaluation and treatment for Trochanteric bursitis of right hip . He has PmHx of bilateral knee pain which he recently received injections which has also improved his hip pain.  Testing demonstrates weak hip flexors and tight post core. He just clears feet from floor with amb and with shortened step length and slowed cadence.  He is a member here at [employer] but has not been exercising due to pain.  He is  planning trip to [place] in may and wants to be able to ambulate without limitation if able.  We did discus a walking stick to be addressed in future sessions.  He will benefit from skilled physical therapy both aquatic and land based to best progress strength and amb toleration preparing for his trip and improve his overall functional mobility/safety with ADL's.  OBJECTIVE IMPAIRMENTS: Abnormal gait, decreased activity tolerance, decreased balance, decreased endurance, decreased mobility, difficulty walking, decreased strength, postural dysfunction, obesity, and pain.   ACTIVITY LIMITATIONS: carrying, lifting, bending, sitting, standing, squatting, stairs, transfers, and locomotion level  PARTICIPATION LIMITATIONS: shopping, community activity, occupation, and yard work  PERSONAL FACTORS: Fitness and 1-2 comorbidities: see problem list  are also affecting patient's functional outcome.   REHAB POTENTIAL: Good  CLINICAL DECISION MAKING: Evolving/moderate complexity  EVALUATION COMPLEXITY: Moderate   GOALS: Goals reviewed with patient? Yes  SHORT TERM GOALS: Target date: 02/17/24 Pt will be indep with initial land based HEP (using [employer] equipment as approp) for continued management of condition Baseline:none Goal status: INITIAL  2.  Pt will tolerate full aquatic sessions  consistently without increase in pain and with improving function to demonstrate good toleration and effectiveness of intervention.  Baseline:  Goal status:MET - 02/07/24  3.  Pt will perform tandem and SLS Baseline: see above Goal status: MET -02/07/24  4.  Pt will improve on Tug test to <or= 13 s to demonstrate improvement in lower extremity function, mobility and decreased fall risk. Baseline: 14.91 at eval; see above Goal status: MET - 02/10/24   LONG TERM GOALS: Target date: 03/23/24  Pt to improve on LEFS by at least 9 point to demonstrate statistically significant Improvement in function. Baseline:  29/80 Goal status: INITIAL  2.  Pt will demonstrate community ambulation distances without limitation of pain or fatigue using AD as needed Baseline:  Goal status: INITIAL  3.  Pt will be indep with final HEP's (land and aquatic as appropriate) for continued management of condition Baseline:  Goal status: INITIAL  4.  Pt will improve on the by at least 40 ft (MDC) or greater (516ft avg for age) to demonstrate improved functional capacity,endurance and gait speed Baseline: 325 ft Goal status: INITIAL  5.   Pt will reports 75% decrease in right hip pain to demonstrate improved function and effectiveness of intervention. Baseline:  Goal status: INITIAL   PLAN:  PT FREQUENCY: 2x/week  PT DURATION: 9 weeks  PLANNED INTERVENTIONS: 97164- PT Re-evaluation, 97110-Therapeutic exercises, 97530- Therapeutic activity, 97112- Neuromuscular re-education, 97535- Self Care, 16109- Manual therapy, [phone]- Gait training, [phone]- Orthotic Fit/training, [phone]- Ionotophoresis 4mg /ml Dexamethasone, Patient/Family education, Balance training, Stair training, Taping, Dry Needling, DME instructions, Cryotherapy, and Moist heat  PLAN FOR NEXT SESSION: alternate land/aquatics Land: would recommend doing a land visit coming up to work on gym machines as appropriate  Both: Hip and posterior core strengthening and stretching, gait and posture correction, body mechanics, aerobic capacity training.  [address] [place]) Yaniah Thiemann MPT 02/15/24 8:56 AM [facility] [address] [place], [place], [address] Phone: [phone]   Fax:  [phone]

## 2024-02-22 ENCOUNTER — Encounter (HOSPITAL_BASED_OUTPATIENT_CLINIC_OR_DEPARTMENT_OTHER): Payer: Self-pay | Admitting: Physical Therapy

## 2024-02-22 ENCOUNTER — Ambulatory Visit (HOSPITAL_BASED_OUTPATIENT_CLINIC_OR_DEPARTMENT_OTHER): Admitting: Physical Therapy

## 2024-02-22 DIAGNOSIS — M25551 Pain in right hip: Secondary | ICD-10-CM

## 2024-02-22 DIAGNOSIS — M6281 Muscle weakness (generalized): Secondary | ICD-10-CM | POA: Diagnosis not present

## 2024-02-22 DIAGNOSIS — R2689 Other abnormalities of gait and mobility: Secondary | ICD-10-CM

## 2024-02-22 NOTE — Therapy (Addendum)
 OUTPATIENT PHYSICAL THERAPY TREATMENT  Progress Note Reporting Period 01/18/24 to 02/22/24  See note below for Objective Data and Assessment of Progress/Goals.     Patient Name: Marc Serrano MRN: [id number] DOB:08-Feb-1951, 73 y.o., male Today's Date: 02/22/2024  END OF SESSION:  PT End of Session - 02/22/24 0812     Visit Number 10    Number of Visits 16    Date for PT Re-Evaluation 03/23/24    Authorization Type BCBS    PT Start Time 0805    PT Stop Time 0845    PT Time Calculation (min) 40 min    Activity Tolerance Patient tolerated treatment well    Behavior During Therapy [facility] for tasks assessed/performed               Past Medical History:  Diagnosis Date   Asthma    Cancer (HCC)    Diabetes mellitus without complication (HCC)    Hypertension    Past Surgical History:  Procedure Laterality Date   APPENDECTOMY     HEMORRHOID SURGERY     sphincterectomy rectal     TONSILLECTOMY     Patient Active Problem List   Diagnosis Date Noted   Acute bronchitis 01/11/2015   SIRS (systemic inflammatory response syndrome) (HCC) 01/10/2015   Diabetes mellitus type 2, uncontrolled 01/10/2015   Hypertension 01/10/2015    PCP: Faustina Hood, MD  REFERRING PROVIDER: Rance Burrows, MD   REFERRING DIAG: M70.61 (ICD-10-CM) - Trochanteric bursitis of right hip   THERAPY DIAG:  Right hip pain  Muscle weakness (generalized)  Other abnormalities of gait and mobility  Rationale for Evaluation and Treatment: Rehabilitation  ONSET DATE: 2-3 years  SUBJECTIVE:  SUBJECTIVE STATEMENT: "No hip pain, bad week so far mentally".    Per eval - Injections in knees a few weeks ago. Hips and knees were hurting bad. Injections cleared up my hips better.  4-5 years ago I was walking 5 miles a day. Walking daily 01-4999 steps a day trying to get back up to 10000.  PERTINENT HISTORY: Bilateral knee pain PAIN:  Are you having pain? no: NPRS scale: 0/10 , just stiffness in  LEs  On eval -  worst 4/10; Pain location: right hip and lateral aspect of thigh/IT band Pain description: dull ache Aggravating factors: walking > 12 minutes Relieving factors: rest, sitting, OTC med  PRECAUTIONS: None  RED FLAGS: None   WEIGHT BEARING RESTRICTIONS: No  FALLS:  Has patient fallen in last 6 months? 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  DIAGNOSTIC FINDINGS: nothing recent  PATIENT SURVEYS:  LEFS 29/80  COGNITION: Overall cognitive status: Within functional limits for tasks assessed     SENSATION: WFL   MUSCLE LENGTH: Hamstrings: bilaterally tight  POSTURE: rounded shoulders, forward head, decreased lumbar lordosis, and flexed trunk   PALPATION: TTP right hip/trochanter and throughout IT band  LOWER EXTREMITY ROM:  Active ROM Right eval Left eval  Hip flexion    Hip extension    Hip abduction    Hip adduction    Hip internal rotation 30d   Hip external rotation 30d   Knee flexion    Knee extension    Ankle dorsiflexion  Ankle plantarflexion    Ankle inversion    Ankle eversion     (Blank rows = not tested)  LOWER EXTREMITY MMT:  MMT Right eval Left eval R/L  Hip flexion 25.7 28.6 33.1 / 37.8  Hip extension     Hip abduction 35.1 36.6   Hip adduction     Hip internal rotation     Hip external rotation     Knee flexion     Knee extension 47.2 49.3   Ankle dorsiflexion     Ankle plantarflexion     Ankle inversion     Ankle eversion      (Blank rows = not tested)  LOWER EXTREMITY SPECIAL TESTS:  Hip special tests: Portia Brittle (FABER) test: negative  FUNCTIONAL TESTS:  Timed up and go (TUG): 14.91 5x STS  20.36  from bench at pool ue use 4/5 reps  2 min Walk test 333ft   02/07/24: SLS -LLE 4.46s, RLE 3.71;  Tandem stance LLE in back 30+ sec, RLE in back 10.1 sec  02/10/24: TUG 11.96s  02/22/24: 5x STS; 15.84 without ue support  GAIT: Distance walked: 325 ft Assistive device utilized: None Level of assistance: Complete Independence Comments: forward hip flexed position, shorted step length, just clearing heels from floor  02/22/24: improved step length with heel strike (although lacking full range)                                                                                                                                TREATMENT  [facility] Adult PT Treatment:                                                DATE: 02/22/24 Pt seen for aquatic therapy today.  Treatment took place in water 3.5-4.75 ft in depth at the [facility] pool. Temp of water was 91.  Pt entered/exited the pool via stairs using step to pattern with hand rail.   *walking forward, back and side stepping 4 ft , 6-unsupported, multiple laps;  *Farmers carry bilateral blue HB cues for engaged core * side stepping with arm addct/ abdct with yellow-blue hand floats x 1 lap-> in to wide squat x 2 laps *tandem walking forward and back ue support blue HB->unsupported  *grapevine x 4 widths no UE support (good execution) *hip hiking R and L 2 x 20. Cues for movement in full range *toe tapping alternating 2nd step 2x 15. 1 LOB with indep recovery *step ups leading l/r x 10 progressed to unsupported *Runners step ups x 10 R/L initially bilat ue support->unilateral  Good execution * R/L hamstring stretch with foot on 2nd step x 15s each x 2   10th visit testing: see charts        Previous Land:  [facility] Adult PT Treatment:  DATE: 01/20/24 Therapeutic Exercise: Red band hip abduction 2x12 seated  Red band hip ER w/ ball as fulcrum x12 Red band hip IR w/ ball as fulcrum  2x10 Seated hip flexion red band 2x10 BIL HEP education + handout, rationale for interventions  Therapeutic Activity: STS from raised mat 4x5 cues for symmetrical WB Slow marches 2x5 BIL w/ UE support  cues for posture and pacing  Education/discussion re: pacing of activities, walking program      PATIENT EDUCATION:  Education details: rationale for interventions, HEP  Person educated: Patient Education method: Explanation, Demonstration, Tactile cues, Verbal cues Education comprehension: verbalized understanding, returned demonstration, verbal cues required, tactile cues required, and needs further education     HOME EXERCISE PROGRAM: Access Code: [email or web address] URL: [email or web address] Date: 01/20/2024 Prepared by: Mayme Spearman  Exercises - Seated Hip Abduction with Resistance  - 2-3 x daily - 1 sets - 8-10 reps - Seated Hip Internal Rotation with Ball and Resistance  - 2-3 x daily - 1 sets - 8-10 reps - Standing March with Counter Support  - 2-3 x daily - 1 sets - 5-8 reps  02/13/24 - walking at [place] 1[place]2 x around (1700 steps as per pt).  He is encouraged to rest as needed on benches and time how long it takes to get around reporting back to PT.  MFZ  ASSESSMENT:  CLINICAL IMPRESSION:  PN:Pt reports he does walk some but has not been following land based HEP other than initially when instructed.  He reports 70% improvement in right hip pain. Testing today demonstrates good progress with strength in bilat hips, improved transitional movements and balance as per TUG and 5xSTS tests. He is ready to begin transitioning to land based intervention for added loading to further Improve strength and balance. He is now heel striking with gait although would benefit from added strength in DF for further improvement.     Per eval - Patient is a 73 y.o. m who was seen today for physical therapy evaluation and treatment for Trochanteric bursitis of right hip . He  has PmHx of bilateral knee pain which he recently received injections which has also improved his hip pain.  Testing demonstrates weak hip flexors and tight post core. He just clears feet from floor with amb and with shortened step length and slowed cadence.  He is a member here at [employer] but has not been exercising due to pain.  He is planning trip to [place] in may and wants to be able to ambulate without limitation if able.  We did discus a walking stick to be addressed in future sessions.  He will benefit from skilled physical therapy both aquatic and land based to best progress strength and amb toleration preparing for his trip and improve his overall functional mobility/safety with ADL's.  OBJECTIVE IMPAIRMENTS: Abnormal gait, decreased activity tolerance, decreased balance, decreased endurance, decreased mobility, difficulty walking, decreased strength, postural dysfunction, obesity, and pain.   ACTIVITY LIMITATIONS: carrying, lifting, bending, sitting, standing, squatting, stairs, transfers, and locomotion level  PARTICIPATION LIMITATIONS: shopping, community activity, occupation, and yard work  PERSONAL FACTORS: Fitness and 1-2 comorbidities: see problem list  are also affecting patient's functional outcome.   REHAB POTENTIAL: Good  CLINICAL DECISION MAKING: Evolving/moderate complexity  EVALUATION COMPLEXITY: Moderate   GOALS: Goals reviewed with patient? Yes  SHORT TERM GOALS: Target date: 02/17/24 Pt will be indep with initial land based HEP (using [employer] equipment as approp) for continued management of condition Baseline:none Goal status: in  progress 02/22/24  2.  Pt will tolerate full aquatic sessions consistently without increase in pain and with improving function to demonstrate good toleration and effectiveness of intervention.  Baseline:  Goal status:MET - 02/07/24  3.  Pt will perform tandem and SLS Baseline: see above Goal status: MET -02/07/24  4.  Pt will improve  on Tug test to <or= 13 s to demonstrate improvement in lower extremity function, mobility and decreased fall risk. Baseline: 14.91 at eval; see above Goal status: MET - 02/10/24   LONG TERM GOALS: Target date: 03/23/24  Pt to improve on LEFS by at least 9 point to demonstrate statistically significant Improvement in function. Baseline: 29/80 Goal status: INITIAL  2.  Pt will demonstrate community ambulation distances without limitation of pain or fatigue using AD as needed Baseline:  Goal status: INITIAL  3.  Pt will be indep with final HEP's (land and aquatic as appropriate) for continued management of condition Baseline:  Goal status: INITIAL  4.  Pt will improve on the by at least 40 ft (MDC) or greater (570ft avg for age) to demonstrate improved functional capacity,endurance and gait speed Baseline: 325 ft Goal status: INITIAL  5.   Pt will reports 75% decrease in right hip pain to demonstrate improved function and effectiveness of intervention. Baseline:  Goal status: In progress 02/22/24   PLAN:  PT FREQUENCY: 2x/week  PT DURATION: 9 weeks  PLANNED INTERVENTIONS: 97164- PT Re-evaluation, 97110-Therapeutic exercises, 97530- Therapeutic activity, 97112- Neuromuscular re-education, 97535- Self Care, 16109- Manual therapy, [phone]- Gait training, [phone]- Orthotic Fit/training, [phone]- Ionotophoresis 4mg /ml Dexamethasone, Patient/Family education, Balance training, Stair training, Taping, Dry Needling, DME instructions, Cryotherapy, and Moist heat  PLAN FOR NEXT SESSION: alternate land/aquatics Land: would recommend doing a land visit coming up to work on gym machines as appropriate  Both: Hip and posterior core strengthening and stretching, gait and posture correction, body mechanics, aerobic capacity training.  Adriana Hopping [place]) Eryca Bolte MPT 02/22/24 12:37 PM [facility] [address] [place], [place], [address] Phone:  [phone]   Fax:  [phone]  Addend Lucinda Saber) Gloriann Riede MPT 02/22/24 12:45 PM [facility] [address] [place], [place], [address] Phone: [phone]   Fax:  [phone]

## 2024-02-24 ENCOUNTER — Ambulatory Visit (HOSPITAL_BASED_OUTPATIENT_CLINIC_OR_DEPARTMENT_OTHER): Admitting: Physical Therapy

## 2024-02-24 ENCOUNTER — Ambulatory Visit (HOSPITAL_BASED_OUTPATIENT_CLINIC_OR_DEPARTMENT_OTHER): Payer: Self-pay | Admitting: Physical Therapy

## 2024-02-24 ENCOUNTER — Encounter (HOSPITAL_BASED_OUTPATIENT_CLINIC_OR_DEPARTMENT_OTHER): Payer: Self-pay | Admitting: Physical Therapy

## 2024-02-24 DIAGNOSIS — R2689 Other abnormalities of gait and mobility: Secondary | ICD-10-CM

## 2024-02-24 DIAGNOSIS — M6281 Muscle weakness (generalized): Secondary | ICD-10-CM

## 2024-02-24 DIAGNOSIS — M25551 Pain in right hip: Secondary | ICD-10-CM | POA: Diagnosis not present

## 2024-02-24 NOTE — Therapy (Signed)
 OUTPATIENT PHYSICAL THERAPY TREATMENT  Progress Note Reporting Period 01/18/24 to 02/22/24  See note below for Objective Data and Assessment of Progress/Goals.     Patient Name: Marc Serrano MRN: [id number] DOB:[birth year], 73 y.o., male Today's Date: 02/24/2024  END OF SESSION:  PT End of Session - 02/24/24 1605     Visit Number 11    Number of Visits 16    Date for PT Re-Evaluation 03/23/24    Authorization Type BCBS    PT Start Time 1603    PT Stop Time 1644    PT Time Calculation (min) 41 min    Activity Tolerance Patient tolerated treatment well;No increased pain    Behavior During Therapy WFL for tasks assessed/performed                Past Medical History:  Diagnosis Date   Asthma    Cancer (HCC)    Diabetes mellitus without complication (HCC)    Hypertension    Past Surgical History:  Procedure Laterality Date   APPENDECTOMY     HEMORRHOID SURGERY     sphincterectomy rectal     TONSILLECTOMY     Patient Active Problem List   Diagnosis Date Noted   Acute bronchitis 01/11/2015   SIRS (systemic inflammatory response syndrome) (HCC) 01/10/2015   Diabetes mellitus type 2, uncontrolled 01/10/2015   Hypertension 01/10/2015    PCP: Faustina Hood, MD  REFERRING PROVIDER: Rance Burrows, MD   REFERRING DIAG: M70.61 (ICD-10-CM) - Trochanteric bursitis of right hip   THERAPY DIAG:  Right hip pain  Other abnormalities of gait and mobility  Muscle weakness (generalized)  Rationale for Evaluation and Treatment: Rehabilitation  ONSET DATE: 2-3 years  SUBJECTIVE:  SUBJECTIVE STATEMENT: 4/25 Pt states no pain today. Wants to expand gym program.    Per eval - Injections in knees a few weeks ago. Hips and knees were hurting bad. Injections cleared up my hips better.  4-5 years ago I was walking 5 miles a day. Walking daily 01-4999 steps a day trying to get back up to 10000.  PERTINENT HISTORY: Bilateral knee pain PAIN:  Are you having pain? no:  NPRS scale: 0/10 , just stiffness in LEs  On eval -  worst 4/10; Pain location: right hip and lateral aspect of thigh/IT band Pain description: dull ache Aggravating factors: walking > 12 minutes Relieving factors: rest, sitting, OTC med  PRECAUTIONS: None  RED FLAGS: None   WEIGHT BEARING RESTRICTIONS: No  FALLS:  Has patient fallen in last 6 months? 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  DIAGNOSTIC FINDINGS: nothing recent  PATIENT SURVEYS:  LEFS 29/80  COGNITION: Overall cognitive status: Within functional limits for tasks assessed     SENSATION: WFL   MUSCLE LENGTH: Hamstrings: bilaterally tight  POSTURE: rounded shoulders, forward head, decreased lumbar lordosis, and flexed trunk   PALPATION: TTP right hip/trochanter and throughout IT band  LOWER EXTREMITY ROM:  Active ROM Right eval Left eval  Hip flexion    Hip extension    Hip abduction    Hip adduction    Hip internal rotation 30d   Hip external rotation 30d   Knee flexion    Knee extension  Ankle dorsiflexion    Ankle plantarflexion    Ankle inversion    Ankle eversion     (Blank rows = not tested)  LOWER EXTREMITY MMT:  MMT Right eval Left eval R/L  Hip flexion 25.7 28.6 33.1 / 37.8  Hip extension     Hip abduction 35.1 36.6   Hip adduction     Hip internal rotation     Hip external rotation     Knee flexion     Knee extension 47.2 49.3   Ankle dorsiflexion     Ankle plantarflexion     Ankle inversion     Ankle eversion      (Blank rows = not tested)  LOWER EXTREMITY SPECIAL TESTS:  Hip special tests: Portia Brittle (FABER) test: negative  FUNCTIONAL TESTS:   Timed up and go (TUG): 14.91 5x STS 20.36  from bench at pool ue use 4/5 reps  2 min Walk test 322ft   02/07/24: SLS -LLE 4.46s, RLE 3.71;  Tandem stance LLE in back 30+ sec, RLE in back 10.1 sec  02/10/24: TUG 11.96s  02/22/24: 5x STS; 15.84 without ue support  GAIT: Distance walked: 325 ft Assistive device utilized: None Level of assistance: Complete Independence Comments: forward hip flexed position, shorted step length, just clearing heels from floor  02/22/24: improved step length with heel strike (although lacking full range)                                                                                                                                TREATMENT  4/25 There-ex: Nustep lvl 3 warm up Leg press 3x10 170lbs RPE 5 Row machine 3x10 70lbs RPE 5  There-Act  Neuro-Re-ed  Cable pullovers 3x10 25lbs RPE 5 with postural and muscle cueing  Hammer curls 3x10 15lbs RPE 5 with postural and muscle cueing  Tricep rope ext 3x10 15lbs RPE 5 with postural and muscle cueing  Ab machine 3x10 70lbs RPE 5 with cueing for abdominal bracing   [facility] Adult PT Treatment:                                                DATE: 02/22/24 Pt seen for aquatic therapy today.  Treatment took place in water 3.5-4.75 ft in depth at the [facility] pool. Temp of water was 91.  Pt entered/exited the pool via stairs using step to pattern with hand rail.   *walking forward, back and side stepping 4 ft , 6-unsupported, multiple laps;  *Farmers carry bilateral blue HB cues for engaged core * side stepping with arm addct/ abdct with yellow-blue hand floats x 1 lap-> in to wide squat x 2 laps *tandem walking forward and back ue support blue HB->unsupported  *grapevine x 4 widths no UE support (good execution) *hip hiking R  and L 2 x 20. Cues for movement in full range *toe tapping alternating 2nd step 2x 15. 1 LOB with indep recovery *step ups leading l/r x 10 progressed to  unsupported *Runners step ups x 10 R/L initially bilat ue support->unilateral  Good execution * R/L hamstring stretch with foot on 2nd step x 15s each x 2   10th visit testing: see charts        Previous Land:  [facility] Adult PT Treatment:                                                DATE: 01/20/24 Therapeutic Exercise: Red band hip abduction 2x12 seated  Red band hip ER w/ ball as fulcrum x12 Red band hip IR w/ ball as fulcrum 2x10 Seated hip flexion red band 2x10 BIL HEP education + handout, rationale for interventions  Therapeutic Activity: STS from raised mat 4x5 cues for symmetrical WB Slow marches 2x5 BIL w/ UE support  cues for posture and pacing  Education/discussion re: pacing of activities, walking program      PATIENT EDUCATION:  Education details: rationale for interventions, HEP  Person educated: Patient Education method: Explanation, Demonstration, Tactile cues, Verbal cues Education comprehension: verbalized understanding, returned demonstration, verbal cues required, tactile cues required, and needs further education     HOME EXERCISE PROGRAM: Access Code: [email or web address] URL: [email or web address] Date: 01/20/2024 Prepared by: Mayme Spearman  Exercises - Seated Hip Abduction with Resistance  - 2-3 x daily - 1 sets - 8-10 reps - Seated Hip Internal Rotation with Ball and Resistance  - 2-3 x daily - 1 sets - 8-10 reps - Standing March with Counter Support  - 2-3 x daily - 1 sets - 5-8 reps  02/13/24 - walking at [place] 1-2 x around (1700 steps as per pt).  He is encouraged to rest as needed on benches and time how long it takes to get around reporting back to PT.  MFZ  ASSESSMENT:  CLINICAL IMPRESSION:  4/25 Pt warmed up on nustep for with no increase in symptom. We reviewed his gym program that he has performed previously. We used RPE to grade his gym exercises We reviewed UE exercises as well. And reviewed the benefits of general  exercises. Therapy will continue to expand his gym program.     Per eval - Patient is a 73 y.o. m who was seen today for physical therapy evaluation and treatment for Trochanteric bursitis of right hip . He has PmHx of bilateral knee pain which he recently received injections which has also improved his hip pain.  Testing demonstrates weak hip flexors and tight post core. He just clears feet from floor with amb and with shortened step length and slowed cadence.  He is a member here at [employer] but has not been exercising due to pain.  He is planning trip to [place] in may and wants to be able to ambulate without limitation if able.  We did discus a walking stick to be addressed in future sessions.  He will benefit from skilled physical therapy both aquatic and land based to best progress strength and amb toleration preparing for his trip and improve his overall functional mobility/safety with ADL's.  OBJECTIVE IMPAIRMENTS: Abnormal gait, decreased activity tolerance, decreased balance, decreased endurance, decreased mobility, difficulty walking, decreased strength, postural dysfunction, obesity, and pain.  ACTIVITY LIMITATIONS: carrying, lifting, bending, sitting, standing, squatting, stairs, transfers, and locomotion level  PARTICIPATION LIMITATIONS: shopping, community activity, occupation, and yard work  PERSONAL FACTORS: Fitness and 1-2 comorbidities: see problem list  are also affecting patient's functional outcome.   REHAB POTENTIAL: Good  CLINICAL DECISION MAKING: Evolving/moderate complexity  EVALUATION COMPLEXITY: Moderate   GOALS: Goals reviewed with patient? Yes  SHORT TERM GOALS: Target date: 02/17/24 Pt will be indep with initial land based HEP (using [employer] equipment as approp) for continued management of condition Baseline:none Goal status: in progress 02/22/24  2.  Pt will tolerate full aquatic sessions consistently without increase in pain and with improving function to  demonstrate good toleration and effectiveness of intervention.  Baseline:  Goal status:MET - 02/07/24  3.  Pt will perform tandem and SLS Baseline: see above Goal status: MET -02/07/24  4.  Pt will improve on Tug test to <or= 13 s to demonstrate improvement in lower extremity function, mobility and decreased fall risk. Baseline: 14.91 at eval; see above Goal status: MET - 02/10/24   LONG TERM GOALS: Target date: 03/23/24  Pt to improve on LEFS by at least 9 point to demonstrate statistically significant Improvement in function. Baseline: 29/80 Goal status: INITIAL  2.  Pt will demonstrate community ambulation distances without limitation of pain or fatigue using AD as needed Baseline:  Goal status: INITIAL  3.  Pt will be indep with final HEP's (land and aquatic as appropriate) for continued management of condition Baseline:  Goal status: INITIAL  4.  Pt will improve on the by at least 40 ft (MDC) or greater (525ft avg for age) to demonstrate improved functional capacity,endurance and gait speed Baseline: 325 ft Goal status: INITIAL  5.   Pt will reports 75% decrease in right hip pain to demonstrate improved function and effectiveness of intervention. Baseline:  Goal status: In progress 02/22/24   PLAN:  PT FREQUENCY: 2x/week  PT DURATION: 9 weeks  PLANNED INTERVENTIONS: 97164- PT Re-evaluation, 97110-Therapeutic exercises, 97530- Therapeutic activity, 97112- Neuromuscular re-education, 97535- Self Care, 16109- Manual therapy, [phone]- Gait training, [phone]- Orthotic Fit/training, [phone]- Ionotophoresis 4mg /ml Dexamethasone, Patient/Family education, Balance training, Stair training, Taping, Dry Needling, DME instructions, Cryotherapy, and Moist heat  PLAN FOR NEXT SESSION: alternate land/aquatics Land: would recommend doing a land visit coming up to work on gym machines as appropriate  Both: Hip and posterior core strengthening and stretching, gait and posture correction,  body mechanics, aerobic capacity training.  Katheran Palms SPT 02/24/24 4:59 PM [facility] [address] [place], [place], [address] Phone: [phone]   Fax:  [phone]

## 2024-02-27 ENCOUNTER — Ambulatory Visit (HOSPITAL_BASED_OUTPATIENT_CLINIC_OR_DEPARTMENT_OTHER): Payer: Self-pay | Admitting: Physical Therapy

## 2024-02-27 ENCOUNTER — Encounter (HOSPITAL_BASED_OUTPATIENT_CLINIC_OR_DEPARTMENT_OTHER): Payer: Self-pay | Admitting: Physical Therapy

## 2024-02-27 DIAGNOSIS — M6281 Muscle weakness (generalized): Secondary | ICD-10-CM | POA: Diagnosis not present

## 2024-02-27 DIAGNOSIS — R2689 Other abnormalities of gait and mobility: Secondary | ICD-10-CM

## 2024-02-27 DIAGNOSIS — M25551 Pain in right hip: Secondary | ICD-10-CM

## 2024-02-27 NOTE — Therapy (Signed)
 OUTPATIENT PHYSICAL THERAPY TREATMENT   Patient Name: Marc Serrano MRN: [id number] DOB:12/20/1950, 73 y.o., male Today's Date: 02/27/2024  END OF SESSION:  PT End of Session - 02/27/24 0857     Visit Number 12    Number of Visits 16    Date for PT Re-Evaluation 03/23/24    Authorization Type BC[employer]    PT Start Time [phone]   arrives late   PT Stop Time 0930    PT Time Calculation (min) 38 min    Activity Tolerance Patient tolerated treatment well;No increased pain    Behavior During Therapy WFL for tasks assessed/performed                 Past Medical History:  Diagnosis Date   Asthma    Cancer (HCC)    Diabetes mellitus without complication (HCC)    Hypertension    Past Surgical History:  Procedure Laterality Date   APPENDECTOMY     HEMORRHOID SURGERY     sphincterectomy rectal     TONSILLECTOMY     Patient Active Problem List   Diagnosis Date Noted   Acute bronchitis 01/11/2015   SIRS (systemic inflammatory response syndrome) (HCC) 01/10/2015   Diabetes mellitus type 2, uncontrolled 01/10/2015   Hypertension 01/10/2015    PCP: Faustina Hood, MD  REFERRING PROVIDER: Rance Burrows, MD   REFERRING DIAG: M70.61 (ICD-10-CM) - Trochanteric bursitis of right hip   THERAPY DIAG:  Right hip pain  Other abnormalities of gait and mobility  Muscle weakness (generalized)  Rationale for Evaluation and Treatment: Rehabilitation  ONSET DATE: 2-3 years  SUBJECTIVE:  SUBJECTIVE STATEMENT: Pt reports shoulder and inner thigh soreness from last time but no pain. Pt is up to 3k steps in a day.   Per eval - Injections in knees a few weeks ago. Hips and knees were hurting bad. Injections cleared up my hips better.  4-5 years ago I was walking 5 miles a day. Walking daily 01-4999 steps a day trying to get back up to 10000.  PERTINENT HISTORY: Bilateral knee pain PAIN:  Are you having pain? no: NPRS scale: 0/10 , just stiffness in LEs  On eval -  worst  4/10; Pain location: right hip and lateral aspect of thigh/IT band Pain description: dull ache Aggravating factors: walking > 12 minutes Relieving factors: rest, sitting, OTC med  PRECAUTIONS: None  RED FLAGS: None   WEIGHT BEARING RESTRICTIONS: No  FALLS:  Has patient fallen in last 6 months? 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  DIAGNOSTIC FINDINGS: nothing recent  PATIENT SURVEYS:  LEFS 29/80  COGNITION: Overall cognitive status: Within functional limits for tasks assessed     SENSATION: WFL   MUSCLE LENGTH: Hamstrings: bilaterally tight  POSTURE: rounded shoulders, forward head, decreased lumbar lordosis, and flexed trunk   PALPATION: TTP right hip/trochanter and throughout IT band  LOWER EXTREMITY ROM:  Active ROM Right eval Left eval  Hip flexion    Hip extension    Hip abduction    Hip adduction    Hip internal rotation 30d   Hip external rotation 30d   Knee flexion    Knee extension    Ankle dorsiflexion    Ankle  plantarflexion    Ankle inversion    Ankle eversion     (Blank rows = not tested)  LOWER EXTREMITY MMT:  MMT Right eval Left eval R/L  Hip flexion 25.7 28.6 33.1 / 37.8  Hip extension     Hip abduction 35.1 36.6   Hip adduction     Hip internal rotation     Hip external rotation     Knee flexion     Knee extension 47.2 49.3   Ankle dorsiflexion     Ankle plantarflexion     Ankle inversion     Ankle eversion      (Blank rows = not tested)  LOWER EXTREMITY SPECIAL TESTS:  Hip special tests: Portia Brittle (FABER) test: negative  FUNCTIONAL TESTS:  Timed up and go (TUG): 14.91 5x STS 20.36  from bench at  pool ue use 4/5 reps  2 min Walk test 365ft   02/07/24: SLS -LLE 4.46s, RLE 3.71;  Tandem stance LLE in back 30+ sec, RLE in back 10.1 sec  02/10/24: TUG 11.96s  02/22/24: 5x STS; 15.84 without ue support  GAIT: Distance walked: 325 ft Assistive device utilized: None Level of assistance: Complete Independence Comments: forward hip flexed position, shorted step length, just clearing heels from floor  02/22/24: improved step length with heel strike (although lacking full range)                                                                                                                                TREATMENT   4/28  Nustep lvl 3 6 min warm up  DL knee ext machine 3s holds 4x5 25lbs  DL HS curl 4U98 3s holds 11BJY  Standing HR 3x10 2s holds  Seated cable row 3x10 40lbs   Gait training with SPC/walking stick 219ft; sizing, sequencing, safety when traveling    4/25 There-ex: Nustep lvl 3 warm up Leg press 3x10 170lbs RPE 5 Row machine 3x10 70lbs RPE 5   There-Act  Neuro-Re-ed  Cable pullovers 3x10 25lbs RPE 5 with postural and muscle cueing  Hammer curls 3x10 15lbs RPE 5 with postural and muscle cueing  Tricep rope ext 3x10 15lbs RPE 5 with postural and muscle cueing  Ab machine 3x10 70lbs RPE 5 with cueing for abdominal bracing   [facility] Adult PT Treatment:                                                DATE: 02/22/24 Pt seen for aquatic therapy today.  Treatment took place in water 3.5-4.75 ft in depth at the [facility] pool. Temp of water was 91.  Pt entered/exited the pool via stairs using step to pattern with hand rail.   *walking forward, back and side stepping 4 ft , 6-unsupported, multiple laps;  *Farmers carry  bilateral blue HB cues for engaged core * side stepping with arm addct/ abdct with yellow-blue hand floats x 1 lap-> in to wide squat x 2 laps *tandem walking forward and back ue support blue HB->unsupported  *grapevine x 4 widths no  UE support (good execution) *hip hiking R and L 2 x 20. Cues for movement in full range *toe tapping alternating 2nd step 2x 15. 1 LOB with indep recovery *step ups leading l/r x 10 progressed to unsupported *Runners step ups x 10 R/L initially bilat ue support->unilateral  Good execution * R/L hamstring stretch with foot on 2nd step x 15s each x 2   10th visit testing: see charts        Previous Land:  [facility] Adult PT Treatment:                                                DATE: 01/20/24 Therapeutic Exercise: Red band hip abduction 2x12 seated  Red band hip ER w/ ball as fulcrum x12 Red band hip IR w/ ball as fulcrum 2x10 Seated hip flexion red band 2x10 BIL HEP education + handout, rationale for interventions  Therapeutic Activity: STS from raised mat 4x5 cues for symmetrical WB Slow marches 2x5 BIL w/ UE support  cues for posture and pacing  Education/discussion re: pacing of activities, walking program      PATIENT EDUCATION:  Education details: rationale for interventions, HEP  Person educated: Patient Education method: Explanation, Demonstration, Tactile cues, Verbal cues Education comprehension: verbalized understanding, returned demonstration, verbal cues required, tactile cues required, and needs further education     HOME EXERCISE PROGRAM: Access Code: [email or web address] URL: [email or web address] Date: 01/20/2024 Prepared by: Mayme Spearman  Exercises - Seated Hip Abduction with Resistance  - 2-3 x daily - 1 sets - 8-10 reps - Seated Hip Internal Rotation with Ball and Resistance  - 2-3 x daily - 1 sets - 8-10 reps - Standing March with Counter Support  - 2-3 x daily - 1 sets - 5-8 reps  02/13/24 - walking at [place] 1-2 x around (1700 steps as per pt).  He is encouraged to rest as needed on benches and time how long it takes to get around reporting back to PT.  [facility]  ASSESSMENT:  CLINICAL IMPRESSION:  Pt able to continue with working on  gym based program to strengthen LE and UE without pain or discomfort. Pt return demo gait instructions with use of AD for his upcoming trip with family. Pt strengthening HEP updated and printed with focus on largely LE. UE options discussed for increase in degrees of freedom for greater muscle recruitment. Continue with land and aquatic visits for return to self exercise.    Per eval - Patient is a 73 y.o. m who was seen today for physical therapy evaluation and treatment for Trochanteric bursitis of right hip . He has PmHx of bilateral knee pain which he recently received injections which has also improved his hip pain.  Testing demonstrates weak hip flexors and tight post core. He just clears feet from floor with amb and with shortened step length and slowed cadence.  He is a member here at [employer] but has not been exercising due to pain.  He is planning trip to [place] in may and wants to be able to ambulate without limitation if able.  We did discus  a walking stick to be addressed in future sessions.  He will benefit from skilled physical therapy both aquatic and land based to best progress strength and amb toleration preparing for his trip and improve his overall functional mobility/safety with ADL's.  OBJECTIVE IMPAIRMENTS: Abnormal gait, decreased activity tolerance, decreased balance, decreased endurance, decreased mobility, difficulty walking, decreased strength, postural dysfunction, obesity, and pain.   ACTIVITY LIMITATIONS: carrying, lifting, bending, sitting, standing, squatting, stairs, transfers, and locomotion level  PARTICIPATION LIMITATIONS: shopping, community activity, occupation, and yard work  PERSONAL FACTORS: Fitness and 1-2 comorbidities: see problem list  are also affecting patient's functional outcome.   REHAB POTENTIAL: Good  CLINICAL DECISION MAKING: Evolving/moderate complexity  EVALUATION COMPLEXITY: Moderate   GOALS: Goals reviewed with patient? Yes  SHORT TERM  GOALS: Target date: 02/17/24 Pt will be indep with initial land based HEP (using [employer] equipment as approp) for continued management of condition Baseline:none Goal status: in progress 02/22/24  2.  Pt will tolerate full aquatic sessions consistently without increase in pain and with improving function to demonstrate good toleration and effectiveness of intervention.  Baseline:  Goal status:MET - 02/07/24  3.  Pt will perform tandem and SLS Baseline: see above Goal status: MET -02/07/24  4.  Pt will improve on Tug test to <or= 13 s to demonstrate improvement in lower extremity function, mobility and decreased fall risk. Baseline: 14.91 at eval; see above Goal status: MET - 02/10/24   LONG TERM GOALS: Target date: 03/23/24  Pt to improve on LEFS by at least 9 point to demonstrate statistically significant Improvement in function. Baseline: 29/80 Goal status: INITIAL  2.  Pt will demonstrate community ambulation distances without limitation of pain or fatigue using AD as needed Baseline:  Goal status: INITIAL  3.  Pt will be indep with final HEP's (land and aquatic as appropriate) for continued management of condition Baseline:  Goal status: INITIAL  4.  Pt will improve on the by at least 40 ft (MDC) or greater (563ft avg for age) to demonstrate improved functional capacity,endurance and gait speed Baseline: 325 ft Goal status: INITIAL  5.   Pt will reports 75% decrease in right hip pain to demonstrate improved function and effectiveness of intervention. Baseline:  Goal status: In progress 02/22/24   PLAN:  PT FREQUENCY: 2x/week  PT DURATION: 9 weeks  PLANNED INTERVENTIONS: 97164- PT Re-evaluation, 97110-Therapeutic exercises, 97530- Therapeutic activity, 97112- Neuromuscular re-education, 97535- Self Care, 45809- Manual therapy, [phone]- Gait training, [phone]- Orthotic Fit/training, [phone]- Ionotophoresis 4mg /ml Dexamethasone, Patient/Family education, Balance training, Stair  training, Taping, Dry Needling, DME instructions, Cryotherapy, and Moist heat  PLAN FOR NEXT SESSION: alternate land/aquatics Land: would recommend doing a land visit coming up to work on gym machines as appropriate  Both: Hip and posterior core strengthening and stretching, gait and posture correction, body mechanics, aerobic capacity training.  Silver Dross PT, DPT 02/27/24 9:42 AM

## 2024-02-29 ENCOUNTER — Ambulatory Visit (HOSPITAL_BASED_OUTPATIENT_CLINIC_OR_DEPARTMENT_OTHER): Admitting: Physical Therapy

## 2024-02-29 DIAGNOSIS — R2689 Other abnormalities of gait and mobility: Secondary | ICD-10-CM | POA: Diagnosis not present

## 2024-02-29 DIAGNOSIS — M6281 Muscle weakness (generalized): Secondary | ICD-10-CM | POA: Diagnosis not present

## 2024-02-29 DIAGNOSIS — M25551 Pain in right hip: Secondary | ICD-10-CM

## 2024-02-29 NOTE — Therapy (Signed)
 OUTPATIENT PHYSICAL THERAPY TREATMENT   Patient Name: MAKIH SEVERSON MRN: [id number] DOB:26-Apr-1951, 73 y.o., male Today's Date: 03/01/2024  END OF SESSION:  PT End of Session - 02/29/24 1304     Visit Number 13    Number of Visits 16    Date for PT Re-Evaluation 03/23/24    Authorization Type BCBS    PT Start Time 0802    PT Stop Time 0842    PT Time Calculation (min) 40 min    Activity Tolerance Patient tolerated treatment well;No increased pain    Behavior During Therapy WFL for tasks assessed/performed                  Past Medical History:  Diagnosis Date   Asthma    Cancer (HCC)    Diabetes mellitus without complication (HCC)    Hypertension    Past Surgical History:  Procedure Laterality Date   APPENDECTOMY     HEMORRHOID SURGERY     sphincterectomy rectal     TONSILLECTOMY     Patient Active Problem List   Diagnosis Date Noted   Acute bronchitis 01/11/2015   SIRS (systemic inflammatory response syndrome) (HCC) 01/10/2015   Diabetes mellitus type 2, uncontrolled 01/10/2015   Hypertension 01/10/2015    PCP: Faustina Hood, MD  REFERRING PROVIDER: Rance Burrows, MD   REFERRING DIAG: M70.61 (ICD-10-CM) - Trochanteric bursitis of right hip   THERAPY DIAG:  Right hip pain  Other abnormalities of gait and mobility  Muscle weakness (generalized)  Rationale for Evaluation and Treatment: Rehabilitation  ONSET DATE: 2-3 years  SUBJECTIVE:  SUBJECTIVE STATEMENT: Pt reports reach 5k steps yesterday without difficulty  Per eval - Injections in knees a few weeks ago. Hips and knees were hurting bad. Injections cleared up my hips better.  4-5 years ago I was walking 5 miles a day. Walking daily 01-4999 steps a day trying to get back up to 10000.  PERTINENT HISTORY: Bilateral knee pain PAIN:  Are you having pain? no: NPRS scale: 0/10 , just stiffness in LEs  On eval -  worst 4/10; Pain location: right hip and lateral aspect of thigh/IT  band Pain description: dull ache Aggravating factors: walking > 12 minutes Relieving factors: rest, sitting, OTC med  PRECAUTIONS: None  RED FLAGS: None   WEIGHT BEARING RESTRICTIONS: No  FALLS:  Has patient fallen in last 6 months? 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  DIAGNOSTIC FINDINGS: nothing recent  PATIENT SURVEYS:  LEFS 29/80  COGNITION: Overall cognitive status: Within functional limits for tasks assessed     SENSATION: WFL   MUSCLE LENGTH: Hamstrings: bilaterally tight  POSTURE: rounded shoulders, forward head, decreased lumbar lordosis, and flexed trunk   PALPATION: TTP right hip/trochanter and throughout IT band  LOWER EXTREMITY ROM:  Active ROM Right eval Left eval  Hip flexion    Hip extension    Hip abduction    Hip adduction    Hip internal rotation 30d   Hip external rotation 30d   Knee flexion    Knee extension    Ankle dorsiflexion    Ankle plantarflexion    Ankle inversion    Ankle eversion     (Blank rows =  not tested)  LOWER EXTREMITY MMT:  MMT Right eval Left eval R/L  Hip flexion 25.7 28.6 33.1 / 37.8  Hip extension     Hip abduction 35.1 36.6   Hip adduction     Hip internal rotation     Hip external rotation     Knee flexion     Knee extension 47.2 49.3   Ankle dorsiflexion     Ankle plantarflexion     Ankle inversion     Ankle eversion      (Blank rows = not tested)  LOWER EXTREMITY SPECIAL TESTS:  Hip special tests: Portia Brittle (FABER) test: negative  FUNCTIONAL TESTS:  Timed up and go (TUG): 14.91 5x STS 20.36  from bench at pool ue use 4/5 reps  2 min Walk test 334ft   02/07/24: SLS -LLE  4.46s, RLE 3.71;  Tandem stance LLE in back 30+ sec, RLE in back 10.1 sec  02/10/24: TUG 11.96s  02/22/24: 5x STS; 15.84 without ue support  GAIT: Distance walked: 325 ft Assistive device utilized: None Level of assistance: Complete Independence Comments: forward hip flexed position, shorted step length, just clearing heels from floor  02/22/24: improved step length with heel strike (although lacking full range)                                                                                                                                TREATMENT  [facility] Adult PT Treatment:                                                DATE: 02/29/24 Pt seen for aquatic therapy today.  Treatment took place in water 3.5-4.75 ft in depth at the [facility] pool. Temp of water was 91.  Pt entered/exited the pool via stairs using step to pattern with hand rail.   *walking forward, back and side stepping 3.6-unsupported, multiple laps;  *Farmers carry bilateral blue HB cues for engaged core * side stepping with arm addct/ abdct with yellow-blue hand floats x 1 lap-> in to wide squat x 2 laps *tandem walking forward and back ue support blue HB->unsupported  *grapevine x 4 widths no UE support  *Runners step ups x 10 R/L initially bilat ue support->unilateral  Good execution * R/L hamstring stretch with foot on 2nd step x 15s each x 2 *hip hiking R and L 2 x 20. Cues for movement in full range *toe tapping alternating 2nd step 2x 15. 1 LOB with indep recovery *noodle stomp with full hollow noodle ue support wall and HB then just hb hip in neutral then externally rotated 2 x 10.  Some difficulty with maintaining SLS. *monster walk x 4 widths.  Vc and demonstration     4/28  Nustep lvl 3 6 min warm up  DL knee  ext machine 3s holds 4x5 25lbs  DL HS curl 2N56 3s holds 21HYQ  Standing HR 3x10 2s holds  Seated cable row 3x10 40lbs   Gait training with SPC/walking stick 247ft; sizing, sequencing,  safety when traveling    4/25 There-ex: Nustep lvl 3 warm up Leg press 3x10 170lbs RPE 5 Row machine 3x10 70lbs RPE 5   There-Act  Neuro-Re-ed  Cable pullovers 3x10 25lbs RPE 5 with postural and muscle cueing  Hammer curls 3x10 15lbs RPE 5 with postural and muscle cueing  Tricep rope ext 3x10 15lbs RPE 5 with postural and muscle cueing  Ab machine 3x10 70lbs RPE 5 with cueing for abdominal bracing   [facility] Adult PT Treatment:                                                DATE: 02/22/24 Pt seen for aquatic therapy today.  Treatment took place in water 3.5-4.75 ft in depth at the [facility] pool. Temp of water was 91.  Pt entered/exited the pool via stairs using step to pattern with hand rail.   *walking forward, back and side stepping 4 ft , 6-unsupported, multiple laps;  *Farmers carry bilateral blue HB cues for engaged core * side stepping with arm addct/ abdct with yellow-blue hand floats x 1 lap-> in to wide squat x 2 laps *tandem walking forward and back ue support blue HB->unsupported  *grapevine x 4 widths no UE support (good execution) *hip hiking R and L 2 x 20. Cues for movement in full range *toe tapping alternating 2nd step 2x 15. 1 LOB with indep recovery *step ups leading l/r x 10 progressed to unsupported *Runners step ups x 10 R/L initially bilat ue support->unilateral  Good execution * R/L hamstring stretch with foot on 2nd step x 15s each x 2   10th visit testing: see charts        Previous Land:  [facility] Adult PT Treatment:                                                DATE: 01/20/24 Therapeutic Exercise: Red band hip abduction 2x12 seated  Red band hip ER w/ ball as fulcrum x12 Red band hip IR w/ ball as fulcrum 2x10 Seated hip flexion red band 2x10 BIL HEP education + handout, rationale for interventions  Therapeutic Activity: STS from raised mat 4x5 cues for symmetrical WB Slow marches 2x5 BIL w/ UE support  cues for posture  and pacing  Education/discussion re: pacing of activities, walking program      PATIENT EDUCATION:  Education details: rationale for interventions, HEP  Person educated: Patient Education method: Explanation, Demonstration, Tactile cues, Verbal cues Education comprehension: verbalized understanding, returned demonstration, verbal cues required, tactile cues required, and needs further education     HOME EXERCISE PROGRAM: Access Code: [email or web address] URL: [email or web address] Date: 01/20/2024 Prepared by: Mayme Spearman  Exercises - Seated Hip Abduction with Resistance  - 2-3 x daily - 1 sets - 8-10 reps - Seated Hip Internal Rotation with Ball and Resistance  - 2-3 x daily - 1 sets - 8-10 reps - Standing March with Counter Support  - 2-3 x daily - 1 sets - 5-8 reps  02/13/24 - [place] 1-2 x around (1700 steps as per pt).  He is encouraged to rest as needed on benches and time how long it takes to get around reporting back to PT.  MFZ  ASSESSMENT:  CLINICAL IMPRESSION:  Progressed balance training by adding noodle stomp which is good challenge.  He requires vc and demonstration as well as multiple trials for execution.  Pt has reached his max potential in pool setting.  He is still planning on trip ti [place] middle of next month.  He will benefit going forward with all appts land based increasing his load and strengthening potential.  He does have last appt scheduled in pool.  Please reschedule with land for final visit as able with filling a cancel appt.     Per eval - Patient is a 73 y.o. m who was seen today for physical therapy evaluation and treatment for Trochanteric bursitis of right hip . He has PmHx of bilateral knee pain which he recently received injections which has also improved his hip pain.  Testing demonstrates weak hip flexors and tight post core. He just clears feet from floor with amb and with shortened step length and slowed cadence.   He is a member here at [employer] but has not been exercising due to pain.  He is planning trip to [place] in may and wants to be able to ambulate without limitation if able.  We did discus a walking stick to be addressed in future sessions.  He will benefit from skilled physical therapy both aquatic and land based to best progress strength and amb toleration preparing for his trip and improve his overall functional mobility/safety with ADL's.  OBJECTIVE IMPAIRMENTS: Abnormal gait, decreased activity tolerance, decreased balance, decreased endurance, decreased mobility, difficulty walking, decreased strength, postural dysfunction, obesity, and pain.   ACTIVITY LIMITATIONS: carrying, lifting, bending, sitting, standing, squatting, stairs, transfers, and locomotion level  PARTICIPATION LIMITATIONS: shopping, community activity, occupation, and yard work  PERSONAL FACTORS: Fitness and 1-2 comorbidities: see problem list  are also affecting patient's functional outcome.   REHAB POTENTIAL: Good  CLINICAL DECISION MAKING: Evolving/moderate complexity  EVALUATION COMPLEXITY: Moderate   GOALS: Goals reviewed with patient? Yes  SHORT TERM GOALS: Target date: 02/17/24 Pt will be indep with initial land based HEP (using [employer] equipment as approp) for continued management of condition Baseline:none Goal status: in progress 02/22/24  2.  Pt will tolerate full aquatic sessions consistently without increase in pain and with improving function to demonstrate good toleration and effectiveness of intervention.  Baseline:  Goal status:MET - 02/07/24  3.  Pt will perform tandem and SLS Baseline: see above Goal status: MET -02/07/24  4.  Pt will improve on Tug test to <or= 13 s to demonstrate improvement in lower extremity function, mobility and decreased fall risk. Baseline: 14.91 at eval; see above Goal status: MET - 02/10/24   LONG TERM GOALS: Target date: 03/23/24  Pt to improve on LEFS by at least 9  point to demonstrate statistically significant Improvement in function. Baseline: 29/80 Goal status: INITIAL  2.  Pt will demonstrate community ambulation distances without limitation of pain or fatigue using AD as needed Baseline:  Goal status: INITIAL  3.  Pt will be indep with final HEP's (land and aquatic as appropriate) for continued management of condition Baseline:  Goal status: INITIAL  4.  Pt will improve on the by at least 40 ft (MDC) or greater (586ft avg for age) to demonstrate improved  functional capacity,endurance and gait speed Baseline: 325 ft Goal status: INITIAL  5.   Pt will reports 75% decrease in right hip pain to demonstrate improved function and effectiveness of intervention. Baseline:  Goal status: In progress 02/22/24   PLAN:  PT FREQUENCY: 2x/week  PT DURATION: 9 weeks  PLANNED INTERVENTIONS: 97164- PT Re-evaluation, 97110-Therapeutic exercises, 97530- Therapeutic activity, 97112- Neuromuscular re-education, 97535- Self Care, 16109- Manual therapy, [phone]- Gait training, [phone]- Orthotic Fit/training, [phone]- Ionotophoresis 4mg /ml Dexamethasone, Patient/Family education, Balance training, Stair training, Taping, Dry Needling, DME instructions, Cryotherapy, and Moist heat  PLAN FOR NEXT SESSION: alternate land/aquatics Land: would recommend doing a land visit coming up to work on gym machines as appropriate  Both: Hip and posterior core strengthening and stretching, gait and posture correction, body mechanics, aerobic capacity training.  Adriana Hopping [place]) Eavan Gonterman MPT 03/01/24 1:06 PM [facility] [address] [place], [place], [address] Phone: [phone]   Fax:  [phone]

## 2024-03-01 ENCOUNTER — Encounter (HOSPITAL_BASED_OUTPATIENT_CLINIC_OR_DEPARTMENT_OTHER): Payer: Self-pay | Admitting: Physical Therapy

## 2024-03-02 ENCOUNTER — Ambulatory Visit (HOSPITAL_BASED_OUTPATIENT_CLINIC_OR_DEPARTMENT_OTHER): Admitting: Physical Therapy

## 2024-03-07 ENCOUNTER — Encounter (HOSPITAL_BASED_OUTPATIENT_CLINIC_OR_DEPARTMENT_OTHER): Payer: Self-pay | Admitting: Physical Therapy

## 2024-03-07 ENCOUNTER — Ambulatory Visit (HOSPITAL_BASED_OUTPATIENT_CLINIC_OR_DEPARTMENT_OTHER): Attending: Sports Medicine | Admitting: Physical Therapy

## 2024-03-07 DIAGNOSIS — R2689 Other abnormalities of gait and mobility: Secondary | ICD-10-CM | POA: Diagnosis not present

## 2024-03-07 DIAGNOSIS — M25551 Pain in right hip: Secondary | ICD-10-CM | POA: Insufficient documentation

## 2024-03-07 DIAGNOSIS — M6281 Muscle weakness (generalized): Secondary | ICD-10-CM | POA: Insufficient documentation

## 2024-03-07 NOTE — Therapy (Signed)
 OUTPATIENT PHYSICAL THERAPY TREATMENT   Patient Name: Marc Serrano MRN: [id number] DOB:1950-12-19, 73 y.o., male Today's Date: 03/07/2024  END OF SESSION:  PT End of Session - 03/07/24 0804     Visit Number 14    Number of Visits 16    Date for PT Re-Evaluation 03/23/24    Authorization Type BCBS    PT Start Time 0802    PT Stop Time 0842    PT Time Calculation (min) 40 min    Activity Tolerance Patient tolerated treatment well;No increased pain    Behavior During Therapy WFL for tasks assessed/performed                   Past Medical History:  Diagnosis Date   Asthma    Cancer (HCC)    Diabetes mellitus without complication (HCC)    Hypertension    Past Surgical History:  Procedure Laterality Date   APPENDECTOMY     HEMORRHOID SURGERY     sphincterectomy rectal     TONSILLECTOMY     Patient Active Problem List   Diagnosis Date Noted   Acute bronchitis 01/11/2015   SIRS (systemic inflammatory response syndrome) (HCC) 01/10/2015   Diabetes mellitus type 2, uncontrolled 01/10/2015   Hypertension 01/10/2015    PCP: Faustina Hood, MD  REFERRING PROVIDER: Rance Burrows, MD   REFERRING DIAG: M70.61 (ICD-10-CM) - Trochanteric bursitis of right hip   THERAPY DIAG:  Right hip pain  Muscle weakness (generalized)  Other abnormalities of gait and mobility  Rationale for Evaluation and Treatment: Rehabilitation  ONSET DATE: 2-3 years  SUBJECTIVE:  SUBJECTIVE STATEMENT: 5/7 Pt states he graduated from pool therapy. Wants to continue with strengthening activities to prepare for his trip on May 20th.   Per eval - Injections in knees a few weeks ago. Hips and knees were hurting bad. Injections cleared up my hips better.  4-5 years ago I was walking 5 miles a day. Walking daily 01-4999 steps a day trying to get back up to 10000.  PERTINENT HISTORY: Bilateral knee pain PAIN:  Are you having pain? no: NPRS scale: 0/10 , just stiffness in LEs  On eval -   worst 4/10; Pain location: right hip and lateral aspect of thigh/IT band Pain description: dull ache Aggravating factors: walking > 12 minutes Relieving factors: rest, sitting, OTC med  PRECAUTIONS: None  RED FLAGS: None   WEIGHT BEARING RESTRICTIONS: No  FALLS:  Has patient fallen in last 6 months? 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  DIAGNOSTIC FINDINGS: nothing recent  PATIENT SURVEYS:  LEFS 29/80  COGNITION: Overall cognitive status: Within functional limits for tasks assessed     SENSATION: WFL   MUSCLE LENGTH: Hamstrings: bilaterally tight  POSTURE: rounded shoulders, forward head, decreased lumbar lordosis, and flexed trunk   PALPATION: TTP right hip/trochanter and throughout IT band  LOWER EXTREMITY ROM:  Active ROM Right eval Left eval  Hip flexion    Hip extension    Hip abduction    Hip adduction    Hip internal rotation 30d   Hip external rotation 30d   Knee flexion    Knee extension    Ankle dorsiflexion    Ankle plantarflexion  Ankle inversion    Ankle eversion     (Blank rows = not tested)  LOWER EXTREMITY MMT:  MMT Right eval Left eval R/L  Hip flexion 25.7 28.6 33.1 / 37.8  Hip extension     Hip abduction 35.1 36.6   Hip adduction     Hip internal rotation     Hip external rotation     Knee flexion     Knee extension 47.2 49.3   Ankle dorsiflexion     Ankle plantarflexion     Ankle inversion     Ankle eversion      (Blank rows = not tested)  LOWER EXTREMITY SPECIAL TESTS:  Hip special tests: Portia Brittle (FABER) test: negative  FUNCTIONAL TESTS:  Timed up and go (TUG): 14.91 5x STS 20.36  from  bench at pool ue use 4/5 reps  2 min Walk test 356ft   02/07/24: SLS -LLE 4.46s, RLE 3.71;  Tandem stance LLE in back 30+ sec, RLE in back 10.1 sec  02/10/24: TUG 11.96s  02/22/24: 5x STS; 15.84 without ue support  GAIT: Distance walked: 325 ft Assistive device utilized: None Level of assistance: Complete Independence Comments: forward hip flexed position, shorted step length, just clearing heels from floor  02/22/24: improved step length with heel strike (although lacking full range)                                                                                                                                TREATMENT  5/7 There-ex: Nu-step lvl 4 Leg press 3x10 110lbs Row machine 3x10 70lbs Chest press machine 3x10 15lbs Tricep ext machine (cybex 3) 3x10 15lbs Bicep curl machine (cybex 2) 3x10 45lbs Ab machine (life fitness 7) 3x10 70lbs    [facility] Adult PT Treatment:                                                DATE: 02/29/24 Pt seen for aquatic therapy today.  Treatment took place in water 3.5-4.75 ft in depth at the [facility] pool. Temp of water was 91.  Pt entered/exited the pool via stairs using step to pattern with hand rail.   *walking forward, back and side stepping 3.6-unsupported, multiple laps;  *Farmers carry bilateral blue HB cues for engaged core * side stepping with arm addct/ abdct with yellow-blue hand floats x 1 lap-> in to wide squat x 2 laps *tandem walking forward and back ue support blue HB->unsupported  *grapevine x 4 widths no UE support  *Runners step ups x 10 R/L initially bilat ue support->unilateral  Good execution * R/L hamstring stretch with foot on 2nd step x 15s each x 2 *hip hiking R and L 2 x 20. Cues for movement in full range *toe tapping alternating 2nd step 2x 15.  1 LOB with indep recovery *noodle stomp with full hollow noodle ue support wall and HB then just hb hip in neutral then externally rotated 2 x 10.  Some  difficulty with maintaining SLS. *monster walk x 4 widths.  Vc and demonstration     4/28  Nustep lvl 3 6 min warm up  DL knee ext machine 3s holds 4x5 25lbs  DL HS curl 2Z36 3s holds 64QIH  Standing HR 3x10 2s holds  Seated cable row 3x10 40lbs   Gait training with SPC/walking stick 267ft; sizing, sequencing, safety when traveling    4/25 There-ex: Nustep lvl 3 warm up Leg press 3x10 170lbs RPE 5 Row machine 3x10 70lbs RPE 5   There-Act  Neuro-Re-ed  Cable pullovers 3x10 25lbs RPE 5 with postural and muscle cueing  Hammer curls 3x10 15lbs RPE 5 with postural and muscle cueing  Tricep rope ext 3x10 15lbs RPE 5 with postural and muscle cueing  Ab machine 3x10 70lbs RPE 5 with cueing for abdominal bracing   [facility] Adult PT Treatment:                                                DATE: 02/22/24 Pt seen for aquatic therapy today.  Treatment took place in water 3.5-4.75 ft in depth at the [facility] pool. Temp of water was 91.  Pt entered/exited the pool via stairs using step to pattern with hand rail.   *walking forward, back and side stepping 4 ft , 6-unsupported, multiple laps;  *Farmers carry bilateral blue HB cues for engaged core * side stepping with arm addct/ abdct with yellow-blue hand floats x 1 lap-> in to wide squat x 2 laps *tandem walking forward and back ue support blue HB->unsupported  *grapevine x 4 widths no UE support (good execution) *hip hiking R and L 2 x 20. Cues for movement in full range *toe tapping alternating 2nd step 2x 15. 1 LOB with indep recovery *step ups leading l/r x 10 progressed to unsupported *Runners step ups x 10 R/L initially bilat ue support->unilateral  Good execution * R/L hamstring stretch with foot on 2nd step x 15s each x 2   10th visit testing: see charts        Previous Land:  [facility] Adult PT Treatment:                                                DATE: 01/20/24 Therapeutic Exercise: Red band  hip abduction 2x12 seated  Red band hip ER w/ ball as fulcrum x12 Red band hip IR w/ ball as fulcrum 2x10 Seated hip flexion red band 2x10 BIL HEP education + handout, rationale for interventions  Therapeutic Activity: STS from raised mat 4x5 cues for symmetrical WB Slow marches 2x5 BIL w/ UE support  cues for posture and pacing  Education/discussion re: pacing of activities, walking program      PATIENT EDUCATION:  Education details: rationale for interventions, HEP  Person educated: Patient Education method: Explanation, Demonstration, Tactile cues, Verbal cues Education comprehension: verbalized understanding, returned demonstration, verbal cues required, tactile cues required, and needs further education     HOME EXERCISE PROGRAM: Access Code: [email or web address] URL: [email or web address] Date: 01/20/2024 Prepared  by: Mayme Spearman  Exercises - Seated Hip Abduction with Resistance  - 2-3 x daily - 1 sets - 8-10 reps - Seated Hip Internal Rotation with Ball and Resistance  - 2-3 x daily - 1 sets - 8-10 reps - Standing March with Counter Support  - 2-3 x daily - 1 sets - 5-8 reps  02/13/24 - walking at [place] 1-2 x around (1700 steps as per pt).  He is encouraged to rest as needed on benches and time how long it takes to get around reporting back to PT.  MFZ  ASSESSMENT:  CLINICAL IMPRESSION: 5/7 Pt warmed up on the nustep for with no increase in symptoms. Session focused on general strengthening. Pt is still wanting to increase strength and endurance in preparation for his trip at the end of May. Pt tolerated all exercises well with proper muscle activation and body mechanics. Pt will continue to benefit from skilled physical therapy to progress strengthening for functional ADL's and community ambulation.       Per eval - Patient is a 73 y.o. m who was seen today for physical therapy evaluation and treatment for Trochanteric bursitis of right hip .  He has PmHx of bilateral knee pain which he recently received injections which has also improved his hip pain.  Testing demonstrates weak hip flexors and tight post core. He just clears feet from floor with amb and with shortened step length and slowed cadence.  He is a member here at [employer] but has not been exercising due to pain.  He is planning trip to [place] in may and wants to be able to ambulate without limitation if able.  We did discus a walking stick to be addressed in future sessions.  He will benefit from skilled physical therapy both aquatic and land based to best progress strength and amb toleration preparing for his trip and improve his overall functional mobility/safety with ADL's.  OBJECTIVE IMPAIRMENTS: Abnormal gait, decreased activity tolerance, decreased balance, decreased endurance, decreased mobility, difficulty walking, decreased strength, postural dysfunction, obesity, and pain.   ACTIVITY LIMITATIONS: carrying, lifting, bending, sitting, standing, squatting, stairs, transfers, and locomotion level  PARTICIPATION LIMITATIONS: shopping, community activity, occupation, and yard work  PERSONAL FACTORS: Fitness and 1-2 comorbidities: see problem list  are also affecting patient's functional outcome.   REHAB POTENTIAL: Good  CLINICAL DECISION MAKING: Evolving/moderate complexity  EVALUATION COMPLEXITY: Moderate   GOALS: Goals reviewed with patient? Yes  SHORT TERM GOALS: Target date: 02/17/24 Pt will be indep with initial land based HEP (using [employer] equipment as approp) for continued management of condition Baseline:none Goal status: in progress 02/22/24  2.  Pt will tolerate full aquatic sessions consistently without increase in pain and with improving function to demonstrate good toleration and effectiveness of intervention.  Baseline:  Goal status:MET - 02/07/24  3.  Pt will perform tandem and SLS Baseline: see above Goal status: MET -02/07/24  4.  Pt will  improve on Tug test to <or= 13 s to demonstrate improvement in lower extremity function, mobility and decreased fall risk. Baseline: 14.91 at eval; see above Goal status: MET - 02/10/24   LONG TERM GOALS: Target date: 03/23/24  Pt to improve on LEFS by at least 9 point to demonstrate statistically significant Improvement in function. Baseline: 29/80 Goal status: INITIAL  2.  Pt will demonstrate community ambulation distances without limitation of pain or fatigue using AD as needed Baseline:  Goal status: INITIAL  3.  Pt will be indep with  final HEP's (land and aquatic as appropriate) for continued management of condition Baseline:  Goal status: INITIAL  4.  Pt will improve on the by at least 40 ft (MDC) or greater (534ft avg for age) to demonstrate improved functional capacity,endurance and gait speed Baseline: 325 ft Goal status: INITIAL  5.   Pt will reports 75% decrease in right hip pain to demonstrate improved function and effectiveness of intervention. Baseline:  Goal status: In progress 02/22/24   PLAN:  PT FREQUENCY: 2x/week  PT DURATION: 9 weeks  PLANNED INTERVENTIONS: 97164- PT Re-evaluation, 97110-Therapeutic exercises, 97530- Therapeutic activity, 97112- Neuromuscular re-education, 97535- Self Care, 40981- Manual therapy, [phone]- Gait training, [phone]- Orthotic Fit/training, [phone]- Ionotophoresis 4mg /ml Dexamethasone, Patient/Family education, Balance training, Stair training, Taping, Dry Needling, DME instructions, Cryotherapy, and Moist heat  PLAN FOR NEXT SESSION: alternate land/aquatics Land: would recommend doing a land visit coming up to work on gym machines as appropriate  Both: Hip and posterior core strengthening and stretching, gait and posture correction, body mechanics, aerobic capacity training.  Katheran Palms SPT 03/07/24 11:27 AM [facility] [address] [place], [place], [address] Phone:  [phone]   Fax:  [phone]

## 2024-03-09 ENCOUNTER — Ambulatory Visit (HOSPITAL_BASED_OUTPATIENT_CLINIC_OR_DEPARTMENT_OTHER): Admitting: Physical Therapy

## 2024-03-14 ENCOUNTER — Encounter (HOSPITAL_BASED_OUTPATIENT_CLINIC_OR_DEPARTMENT_OTHER): Payer: Self-pay

## 2024-03-14 ENCOUNTER — Ambulatory Visit (HOSPITAL_BASED_OUTPATIENT_CLINIC_OR_DEPARTMENT_OTHER)

## 2024-03-14 DIAGNOSIS — R2689 Other abnormalities of gait and mobility: Secondary | ICD-10-CM

## 2024-03-14 DIAGNOSIS — M25551 Pain in right hip: Secondary | ICD-10-CM

## 2024-03-14 DIAGNOSIS — M6281 Muscle weakness (generalized): Secondary | ICD-10-CM

## 2024-03-14 NOTE — Therapy (Signed)
 OUTPATIENT PHYSICAL THERAPY TREATMENT   Patient Name: Marc Serrano MRN: [id number] DOB:1950/12/18, 73 y.o., male Today's Date: 03/14/2024  END OF SESSION:  PT End of Session - 03/14/24 0805     Visit Number 15    Number of Visits 16    Date for PT Re-Evaluation 03/23/24    Authorization Type BCBS    PT Start Time 0804    PT Stop Time 0852    PT Time Calculation (min) 48 min    Activity Tolerance Patient tolerated treatment well    Behavior During Therapy [facility] for tasks assessed/performed                    Past Medical History:  Diagnosis Date   Asthma    Cancer (HCC)    Diabetes mellitus without complication (HCC)    Hypertension    Past Surgical History:  Procedure Laterality Date   APPENDECTOMY     HEMORRHOID SURGERY     sphincterectomy rectal     TONSILLECTOMY     Patient Active Problem List   Diagnosis Date Noted   Acute bronchitis 01/11/2015   SIRS (systemic inflammatory response syndrome) (HCC) 01/10/2015   Diabetes mellitus type 2, uncontrolled 01/10/2015   Hypertension 01/10/2015    PCP: Faustina Hood, MD  REFERRING PROVIDER: Rance Burrows, MD   REFERRING DIAG: M70.61 (ICD-10-CM) - Trochanteric bursitis of right hip   THERAPY DIAG:  Right hip pain  Muscle weakness (generalized)  Other abnormalities of gait and mobility  Rationale for Evaluation and Treatment: Rehabilitation  ONSET DATE: 2-3 years  SUBJECTIVE:  SUBJECTIVE STATEMENT: Pt reports he started having pain in wrist a few days ago. Arrives with wrist brace he borrowed from his wife. Leaves for [place] next Tuesday. Hip has been doing well.   Per eval - Injections in knees a few weeks ago. Hips and knees were hurting bad. Injections cleared up my hips better.  4-5 years ago I was walking 5 miles a day. Walking daily 01-4999 steps a day trying to get back up to 10000.  PERTINENT HISTORY: Bilateral knee pain PAIN:  Are you having pain? no: NPRS scale: 0/10 , just  stiffness in LEs  On eval -  worst 4/10; Pain location: right hip and lateral aspect of thigh/IT band Pain description: dull ache Aggravating factors: walking > 12 minutes Relieving factors: rest, sitting, OTC med  PRECAUTIONS: None  RED FLAGS: None   WEIGHT BEARING RESTRICTIONS: No  FALLS:  Has patient fallen in last 6 months? 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  DIAGNOSTIC FINDINGS: nothing recent  PATIENT SURVEYS:  LEFS 29/80 5/14: 37/80  COGNITION: Overall cognitive status: Within functional limits for tasks assessed     SENSATION: WFL   MUSCLE LENGTH: Hamstrings: bilaterally tight  POSTURE: rounded shoulders, forward head, decreased lumbar lordosis, and flexed trunk   PALPATION: TTP right hip/trochanter and throughout IT band  LOWER EXTREMITY ROM:  Active ROM Right eval Left eval  Hip flexion    Hip extension    Hip abduction    Hip adduction    Hip internal rotation 30d   Hip external rotation 30d   Knee flexion    Knee extension  Ankle dorsiflexion    Ankle plantarflexion    Ankle inversion    Ankle eversion     (Blank rows = not tested)  LOWER EXTREMITY MMT:  MMT Right eval Left eval R/L  Hip flexion 25.7 28.6 33.1 / 37.8  Hip extension     Hip abduction 35.1 36.6   Hip adduction     Hip internal rotation     Hip external rotation     Knee flexion     Knee extension 47.2 49.3   Ankle dorsiflexion     Ankle plantarflexion     Ankle inversion     Ankle eversion      (Blank rows = not tested)  LOWER EXTREMITY SPECIAL TESTS:  Hip special tests: Portia Brittle (FABER) test: negative  FUNCTIONAL TESTS:  Timed up  and go (TUG): 14.91 5x STS 20.36 from bench at pool ue use 4/5 reps  2 min Walk test 351ft   02/07/24: SLS -LLE 4.46s, RLE 3.71;  Tandem stance LLE in back 30+ sec, RLE in back 10.1 sec  02/10/24: TUG 11.96s  02/22/24: 5x STS; 15.84 without ue support   5/14: 332ft     GAIT: Distance walked: 325 ft Assistive device utilized: None Level of assistance: Complete Independence Comments: forward hip flexed position, shorted step length, just clearing heels from floor  02/22/24: improved step length with heel strike (although lacking full range)                                                                                                                                TREATMENT    5/14 There-ex: Nu-step lvl 5 (Les only) Standing hip abd/ext RTB at ankles 2x10ea Piriformis stretch 3x30sec bil LE Bridges 2x10 Sidelying clams- trialled, but painful on greater troch- switched to supine clams RTB x30 HEP update/review   TherAct: Sit to stands from plinth 2x10 Step ups 6" few and lateral 2x10ea RLE     5/7 There-ex: Nu-step lvl 4 Leg press 3x10 110lbs Row machine 3x10 70lbs Chest press machine 3x10 15lbs Tricep ext machine (cybex 3) 3x10 15lbs Bicep curl machine (cybex 2) 3x10 45lbs Ab machine (life fitness 7) 3x10 70lbs    [facility] Adult PT Treatment:                                                DATE: 02/29/24 Pt seen for aquatic therapy today.  Treatment took place in water 3.5-4.75 ft in depth at the [facility] pool. Temp of water was 91.  Pt entered/exited the pool via stairs using step to pattern with hand rail.   *walking forward, back and side stepping 3.6-unsupported, multiple laps;  *Farmers carry bilateral blue HB cues for engaged core * side stepping with arm addct/ abdct with  yellow-blue hand floats x 1 lap-> in to wide squat x 2 laps *tandem walking forward and back ue support blue HB->unsupported  *grapevine x 4  widths no UE support  *Runners step ups x 10 R/L initially bilat ue support->unilateral  Good execution * R/L hamstring stretch with foot on 2nd step x 15s each x 2 *hip hiking R and L 2 x 20. Cues for movement in full range *toe tapping alternating 2nd step 2x 15. 1 LOB with indep recovery *noodle stomp with full hollow noodle ue support wall and HB then just hb hip in neutral then externally rotated 2 x 10.  Some difficulty with maintaining SLS. *monster walk x 4 widths.  Vc and demonstration     4/28  Nustep lvl 3 6 min warm up  DL knee ext machine 3s holds 4x5 25lbs  DL HS curl 8J19 3s holds 14NWG  Standing HR 3x10 2s holds  Seated cable row 3x10 40lbs   Gait training with SPC/walking stick 24ft; sizing, sequencing, safety when traveling    4/25 There-ex: Nustep lvl 3 warm up Leg press 3x10 170lbs RPE 5 Row machine 3x10 70lbs RPE 5   There-Act  Neuro-Re-ed  Cable pullovers 3x10 25lbs RPE 5 with postural and muscle cueing  Hammer curls 3x10 15lbs RPE 5 with postural and muscle cueing  Tricep rope ext 3x10 15lbs RPE 5 with postural and muscle cueing  Ab machine 3x10 70lbs RPE 5 with cueing for abdominal bracing   [facility] Adult PT Treatment:                                                DATE: 02/22/24 Pt seen for aquatic therapy today.  Treatment took place in water 3.5-4.75 ft in depth at the [facility] pool. Temp of water was 91.  Pt entered/exited the pool via stairs using step to pattern with hand rail.   *walking forward, back and side stepping 4 ft , 6-unsupported, multiple laps;  *Farmers carry bilateral blue HB cues for engaged core * side stepping with arm addct/ abdct with yellow-blue hand floats x 1 lap-> in to wide squat x 2 laps *tandem walking forward and back ue support blue HB->unsupported  *grapevine x 4 widths no UE support (good execution) *hip hiking R and L 2 x 20. Cues for movement in full range *toe tapping alternating 2nd  step 2x 15. 1 LOB with indep recovery *step ups leading l/r x 10 progressed to unsupported *Runners step ups x 10 R/L initially bilat ue support->unilateral  Good execution * R/L hamstring stretch with foot on 2nd step x 15s each x 2   10th visit testing: see charts        Previous Land:  [facility] Adult PT Treatment:                                                DATE: 01/20/24 Therapeutic Exercise: Red band hip abduction 2x12 seated  Red band hip ER w/ ball as fulcrum x12 Red band hip IR w/ ball as fulcrum 2x10 Seated hip flexion red band 2x10 BIL HEP education + handout, rationale for interventions  Therapeutic Activity: STS from raised mat 4x5 cues for symmetrical  WB Slow marches 2x5 BIL w/ UE support  cues for posture and pacing  Education/discussion re: pacing of activities, walking program      PATIENT EDUCATION:  Education details: rationale for interventions, HEP  Person educated: Patient Education method: Explanation, Demonstration, Tactile cues, Verbal cues Education comprehension: verbalized understanding, returned demonstration, verbal cues required, tactile cues required, and needs further education     HOME EXERCISE PROGRAM: Access Code: [email or web address] URL: [email or web address] Date: 01/20/2024 Prepared by: Mayme Spearman  Exercises - Seated Hip Abduction with Resistance  - 2-3 x daily - 1 sets - 8-10 reps - Seated Hip Internal Rotation with Ball and Resistance  - 2-3 x daily - 1 sets - 8-10 reps - Standing March with Counter Support  - 2-3 x daily - 1 sets - 5-8 reps  02/13/24 - walking at [place] 1-2 x around (1700 steps as per pt).  He is encouraged to rest as needed on benches and time how long it takes to get around reporting back to PT.  MFZ  ASSESSMENT:  CLINICAL IMPRESSION: Pt with presentation of DeQuervain's syndrome. Pt reported he was interested in seeing PA upstairs at same day clinic to have this checked out. Reviewed goals  with pt. He has not yet tried pushing himself to walk longer distances, but plans to try this weekend. He reports significant improvement in pain level since starting PT. LEFS improved to 37/80. 2 MWT also improved. Updated HEP and reviewed with pt. Instructed to complete hip stretching while in [place] since his activity level will be higher. Pt reports wanting to wait until he returns to schedule additional PT.       Per eval - Patient is a 73 y.o. m who was seen today for physical therapy evaluation and treatment for Trochanteric bursitis of right hip . He has PmHx of bilateral knee pain which he recently received injections which has also improved his hip pain.  Testing demonstrates weak hip flexors and tight post core. He just clears feet from floor with amb and with shortened step length and slowed cadence.  He is a member here at [employer] but has not been exercising due to pain.  He is planning trip to [place] in may and wants to be able to ambulate without limitation if able.  We did discus a walking stick to be addressed in future sessions.  He will benefit from skilled physical therapy both aquatic and land based to best progress strength and amb toleration preparing for his trip and improve his overall functional mobility/safety with ADL's.  OBJECTIVE IMPAIRMENTS: Abnormal gait, decreased activity tolerance, decreased balance, decreased endurance, decreased mobility, difficulty walking, decreased strength, postural dysfunction, obesity, and pain.   ACTIVITY LIMITATIONS: carrying, lifting, bending, sitting, standing, squatting, stairs, transfers, and locomotion level  PARTICIPATION LIMITATIONS: shopping, community activity, occupation, and yard work  PERSONAL FACTORS: Fitness and 1-2 comorbidities: see problem list are also affecting patient's functional outcome.   REHAB POTENTIAL: Good  CLINICAL DECISION MAKING: Evolving/moderate complexity  EVALUATION COMPLEXITY:  Moderate   GOALS: Goals reviewed with patient? Yes  SHORT TERM GOALS: Target date: 02/17/24 Pt will be indep with initial land based HEP (using [employer] equipment as approp) for continued management of condition Baseline:none Goal status: in progress 02/22/24  2.  Pt will tolerate full aquatic sessions consistently without increase in pain and with improving function to demonstrate good toleration and effectiveness of intervention.  Baseline:  Goal status:MET - 02/07/24  3.  Pt will perform tandem and SLS  Baseline: see above Goal status: MET -02/07/24  4.  Pt will improve on Tug test to <or= 13 s to demonstrate improvement in lower extremity function, mobility and decreased fall risk. Baseline: 14.91 at eval; see above Goal status: MET - 02/10/24   LONG TERM GOALS: Target date: 03/23/24  Pt to improve on LEFS by at least 9 point to demonstrate statistically significant Improvement in function. Baseline: 29/80 Goal status: INITIAL  2.  Pt will demonstrate community ambulation distances without limitation of pain or fatigue using AD as needed Baseline:  Goal status: IN PROGRESS (hasn't tried longer distances yet) 5/14  3.  Pt will be indep with final HEP's (land and aquatic as appropriate) for continued management of condition Baseline:  Goal status: IN PROGRESS 5/14  4.  Pt will improve on the by at least 40 ft (MDC) or greater (511ft avg for age) to demonstrate improved functional capacity,endurance and gait speed Baseline: 325 ft Goal status: INITIAL  5.   Pt will reports 75% decrease in right hip pain to demonstrate improved function and effectiveness of intervention. Baseline:  Goal status: In progress 70% 5/14   PLAN:  PT FREQUENCY: 2x/week  PT DURATION: 9 weeks  PLANNED INTERVENTIONS: 97164- PT Re-evaluation, 97110-Therapeutic exercises, 97530- Therapeutic activity, 97112- Neuromuscular re-education, 97535- Self Care, 16109- Manual therapy, [phone]- Gait training,  [phone]- Orthotic Fit/training, [phone]- Ionotophoresis 4mg /ml Dexamethasone, Patient/Family education, Balance training, Stair training, Taping, Dry Needling, DME instructions, Cryotherapy, and Moist heat  PLAN FOR NEXT SESSION: alternate land/aquatics Land: would recommend doing a land visit coming up to work on gym machines as appropriate  Both: Hip and posterior core strengthening and stretching, gait and posture correction, body mechanics, aerobic capacity training.  Herb Loges, PTA  03/14/24 8:59 AM [facility] [address] [place], [place], [address] Phone: [phone]   Fax:  [phone]

## 2024-03-15 DIAGNOSIS — M25531 Pain in right wrist: Secondary | ICD-10-CM | POA: Diagnosis not present

## 2024-03-16 ENCOUNTER — Ambulatory Visit (HOSPITAL_BASED_OUTPATIENT_CLINIC_OR_DEPARTMENT_OTHER): Admitting: Physical Therapy

## 2024-03-19 ENCOUNTER — Ambulatory Visit (HOSPITAL_BASED_OUTPATIENT_CLINIC_OR_DEPARTMENT_OTHER): Admitting: Physical Therapy

## 2024-03-21 ENCOUNTER — Encounter (HOSPITAL_BASED_OUTPATIENT_CLINIC_OR_DEPARTMENT_OTHER): Admitting: Physical Therapy

## 2024-03-23 ENCOUNTER — Ambulatory Visit (HOSPITAL_BASED_OUTPATIENT_CLINIC_OR_DEPARTMENT_OTHER): Admitting: Physical Therapy

## 2024-05-14 DIAGNOSIS — E119 Type 2 diabetes mellitus without complications: Secondary | ICD-10-CM | POA: Diagnosis not present

## 2024-07-10 DIAGNOSIS — L821 Other seborrheic keratosis: Secondary | ICD-10-CM | POA: Diagnosis not present

## 2024-07-10 DIAGNOSIS — D225 Melanocytic nevi of trunk: Secondary | ICD-10-CM | POA: Diagnosis not present

## 2024-07-10 DIAGNOSIS — Q825 Congenital non-neoplastic nevus: Secondary | ICD-10-CM | POA: Diagnosis not present

## 2024-07-10 DIAGNOSIS — L814 Other melanin hyperpigmentation: Secondary | ICD-10-CM | POA: Diagnosis not present

## 2024-07-10 DIAGNOSIS — L570 Actinic keratosis: Secondary | ICD-10-CM | POA: Diagnosis not present

## 2024-07-31 DIAGNOSIS — Z0000 Encounter for general adult medical examination without abnormal findings: Secondary | ICD-10-CM | POA: Diagnosis not present

## 2024-07-31 DIAGNOSIS — E1169 Type 2 diabetes mellitus with other specified complication: Secondary | ICD-10-CM | POA: Diagnosis not present

## 2024-07-31 DIAGNOSIS — Z125 Encounter for screening for malignant neoplasm of prostate: Secondary | ICD-10-CM | POA: Diagnosis not present

## 2024-07-31 DIAGNOSIS — I10 Essential (primary) hypertension: Secondary | ICD-10-CM | POA: Diagnosis not present

## 2024-07-31 DIAGNOSIS — G4733 Obstructive sleep apnea (adult) (pediatric): Secondary | ICD-10-CM | POA: Diagnosis not present

## 2024-07-31 DIAGNOSIS — Z23 Encounter for immunization: Secondary | ICD-10-CM | POA: Diagnosis not present

## 2024-07-31 DIAGNOSIS — F325 Major depressive disorder, single episode, in full remission: Secondary | ICD-10-CM | POA: Diagnosis not present
